# Patient Record
Sex: Male | Born: 1960
Health system: Midwestern US, Community
[De-identification: ages and names within clinical notes are randomized; demographics above are authoritative.]

## PROBLEM LIST (undated history)

## (undated) DIAGNOSIS — I10 Essential (primary) hypertension: Secondary | ICD-10-CM

## (undated) DIAGNOSIS — G629 Polyneuropathy, unspecified: Secondary | ICD-10-CM

## (undated) DIAGNOSIS — C801 Malignant (primary) neoplasm, unspecified: Secondary | ICD-10-CM

## (undated) DIAGNOSIS — R000 Tachycardia, unspecified: Secondary | ICD-10-CM

## (undated) HISTORY — PX: BACK SURGERY: SHX140

## (undated) HISTORY — PX: CYSTOSCOPY: SUR368

---

## 2001-07-03 ENCOUNTER — Ambulatory Visit (HOSPITAL_COMMUNITY): Admission: RE | Admit: 2001-07-03 | Discharge: 2001-07-03 | Payer: Self-pay | Admitting: Neurosurgery

## 2001-07-03 ENCOUNTER — Encounter: Payer: Self-pay | Admitting: Neurosurgery

## 2001-07-15 ENCOUNTER — Encounter: Payer: Self-pay | Admitting: Neurosurgery

## 2001-07-19 ENCOUNTER — Inpatient Hospital Stay (HOSPITAL_COMMUNITY): Admission: RE | Admit: 2001-07-19 | Discharge: 2001-07-20 | Payer: Self-pay | Admitting: Neurosurgery

## 2001-07-19 ENCOUNTER — Encounter: Payer: Self-pay | Admitting: Neurosurgery

## 2004-11-07 ENCOUNTER — Emergency Department (HOSPITAL_COMMUNITY): Admission: EM | Admit: 2004-11-07 | Discharge: 2004-11-08 | Payer: Self-pay | Admitting: Emergency Medicine

## 2005-05-19 ENCOUNTER — Emergency Department (HOSPITAL_COMMUNITY): Admission: EM | Admit: 2005-05-19 | Discharge: 2005-05-19 | Payer: Self-pay | Admitting: Emergency Medicine

## 2006-07-28 IMAGING — CT CT ANGIO CHEST
3 of 6 series · 18 of 30 positions shown · IV contrast ([ID] OMNI 300)
Comparison: none

CLINICAL DATA: Pneumonia.  Left chest pain.  Elevated ______.  Shortness of breath.

[Series 4: pe · axial · 0.70mm/px · z∈[-327,-64]mm · 14 of 492 slices shown]
[im 36/492  lung]
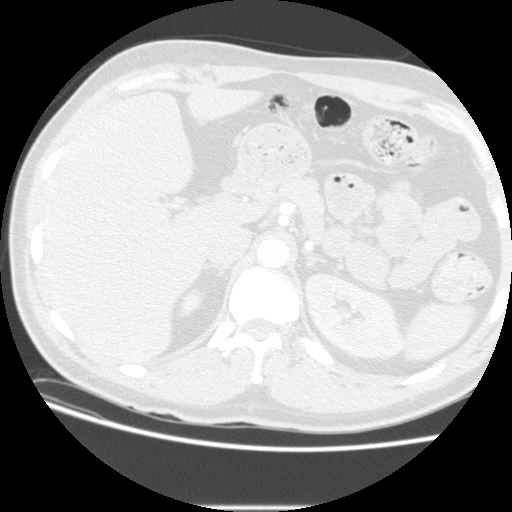
[im 71/492  mediastinal]
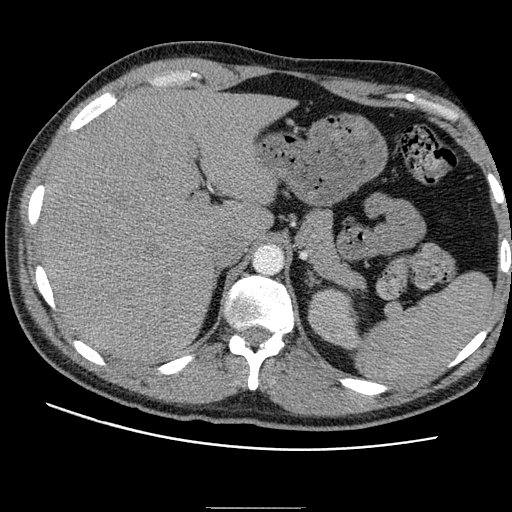
[im 106/492  lung]
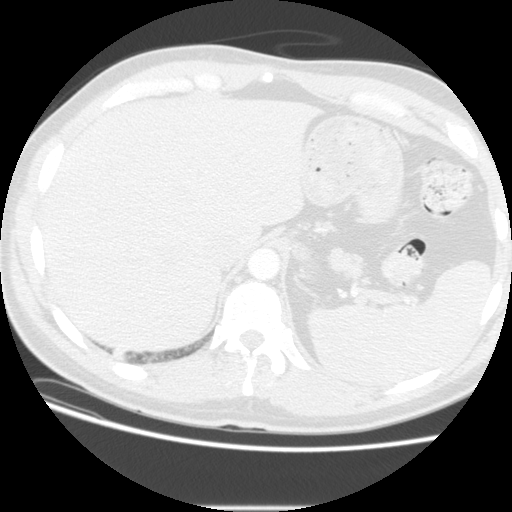
[im 141/492  mediastinal]
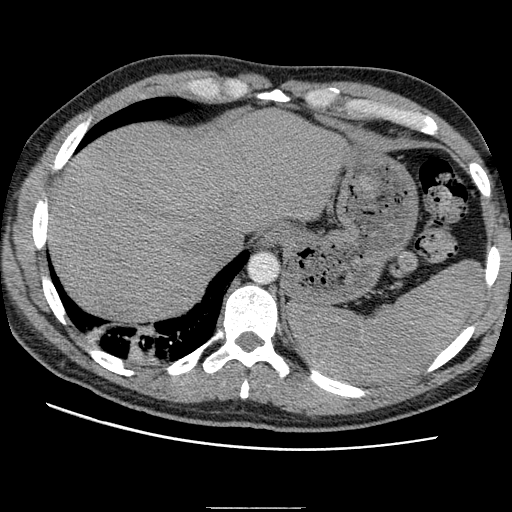
[im 176/492  lung]
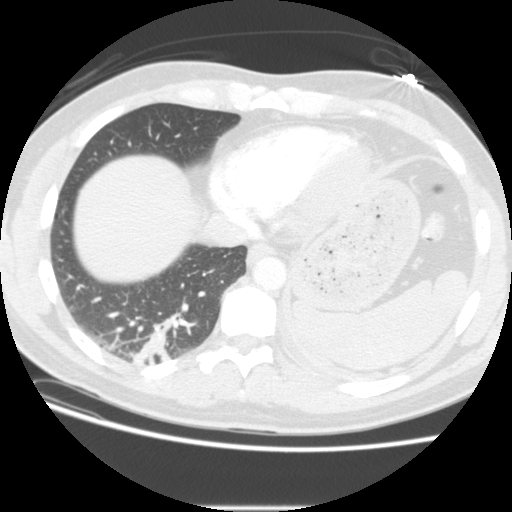
[im 211/492  mediastinal]
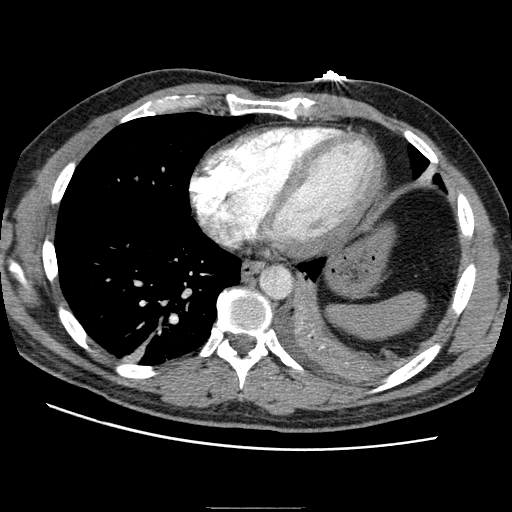
[im 246/492  lung]
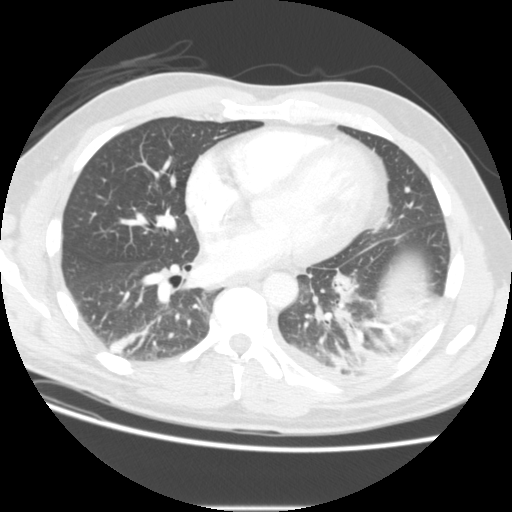
[im 252/492  mediastinal]
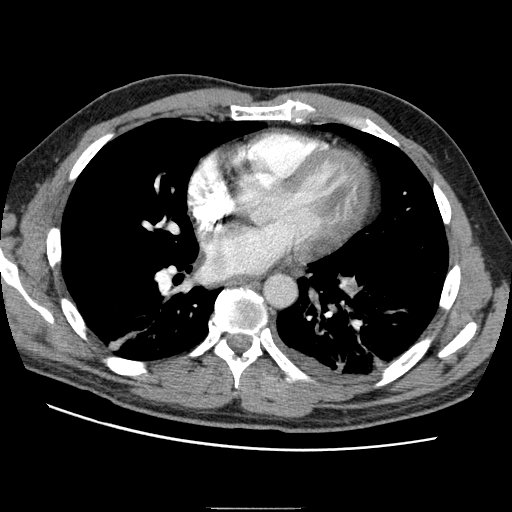
[im 281/492  lung]
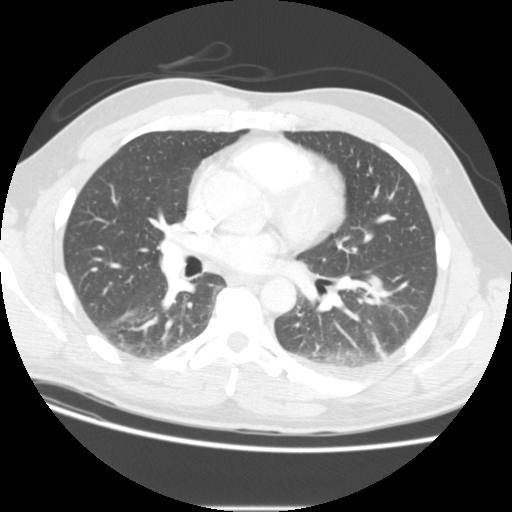
[im 316/492  mediastinal]
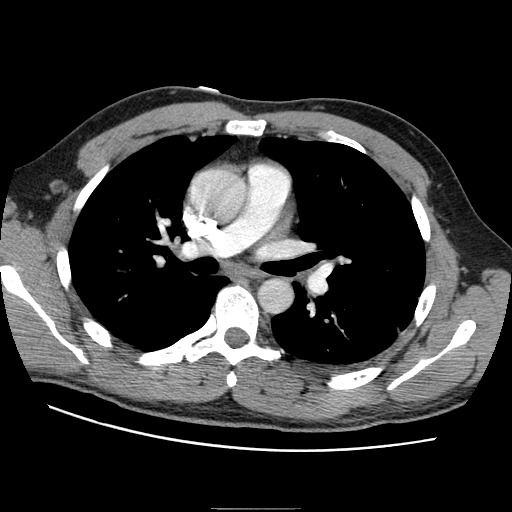
[im 351/492  lung]
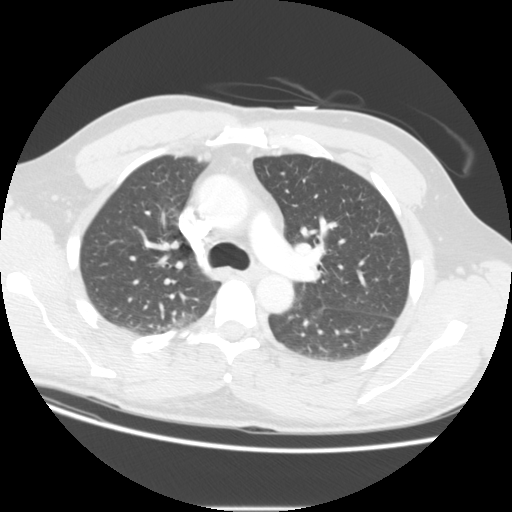
[im 386/492  mediastinal]
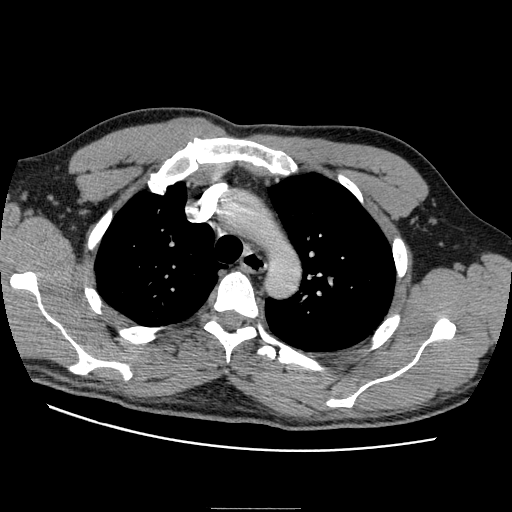
[im 421/492  lung]
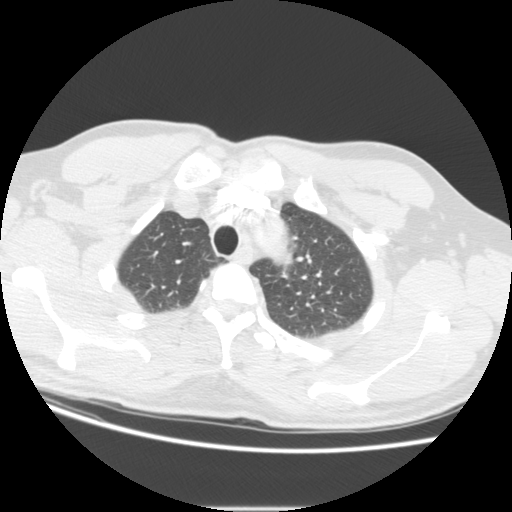
[im 456/492  mediastinal]
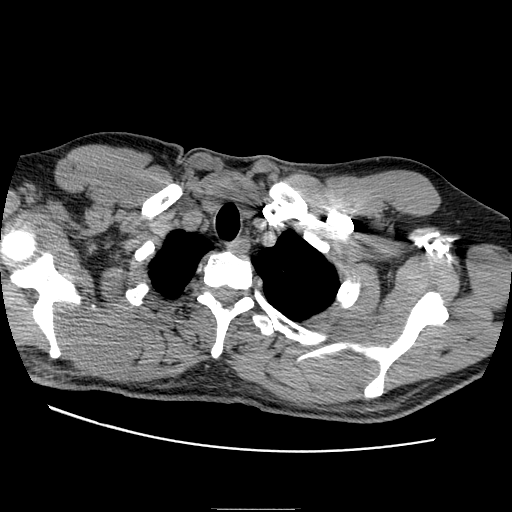

[Series 400: reformatted · sagittal · 0.70mm/px · 2 of 134 slices shown (1 of 2)]
[im 45/134  lung]
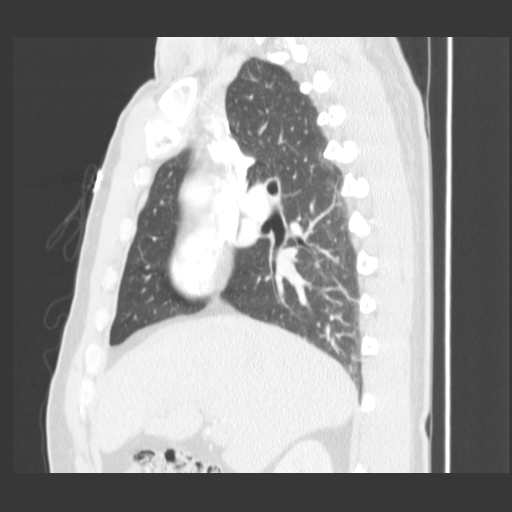
[im 89/134  lung]
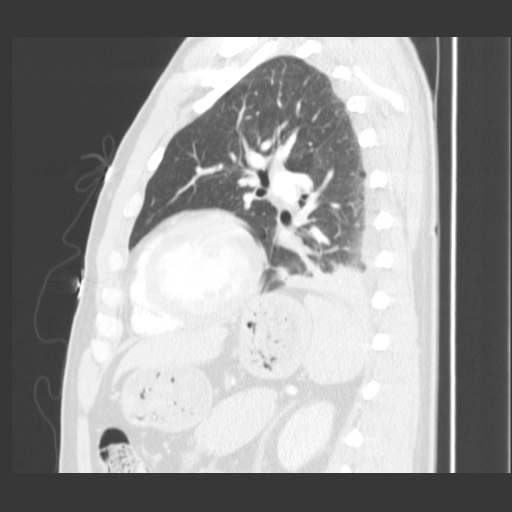

[Series 401: reformatted · coronal · 0.70mm/px · 2 of 115 slices shown (2 of 2)]
[im 39/115  lung]
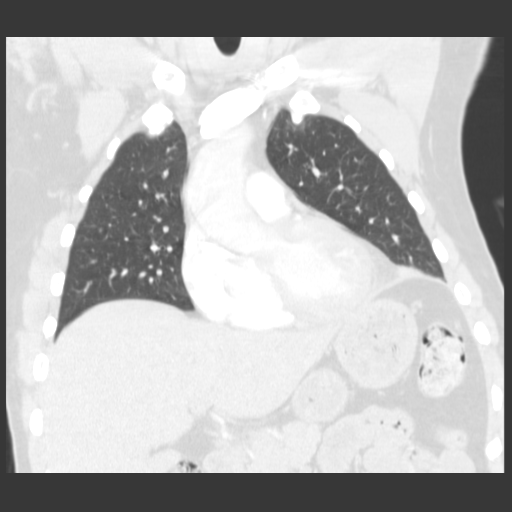
[im 77/115  lung]
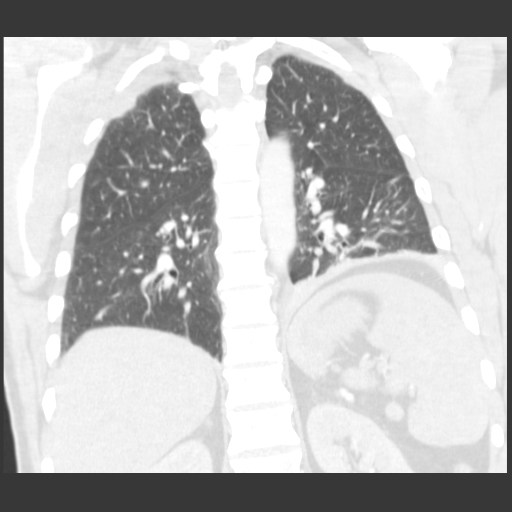

[18 of 30 positions shown; findings below may reference images not displayed]

CHEST CT ANGIO WITH CONTRAST:
Thin section transaxial cuts were initially acquired during IV infusion of 782cc of Omnipaque 300.   From the axial data set images were reconstructed in coronal and sagittal planes.   Findings compatible with bilateral lower lobar pneumonia mainly in the left lower lobe.   Small left pleural effusion.   Atelectatic changes in the lower lobes as well.   Negative for PE.   Slightly prominent in size but not pathologically enlarged mediastinal nodes are noted, suspect representing hyperplastic/reactive nodes.
IMPRESSION: Bilateral lower lobar pneumonia. Left lower lobe bronchitic changes.   Small left pleural effusion.   Negative for PE.

## 2018-08-24 ENCOUNTER — Encounter: Attending: Urology

## 2018-08-24 NOTE — Progress Notes (Deleted)
HISTORY OF PRESENT ILLNESS:  Gary Sloan is a 57 y.o. male who is seen in consultation as referred by Dr. No primary care provider on file. for traumatic injury to external genitalia.        No flowsheet data found.      No past medical history on file.    No past surgical history on file.    Social History     Tobacco Use   ??? Smoking status: Not on file   Substance Use Topics   ??? Alcohol use: Not on file   ??? Drug use: Not on file       Allergies not on file    No family history on file.        Review of Systems  Constitutional: Fever:   Skin: Rash:   HEENT: Hearing difficulty:   Eyes: Blurred vision:   Cardiovascular: Chest pain:   Respiratory: Shortness of breath:   Gastrointestinal: Nausea/vomiting:   Musculoskeletal: Back pain:   Neurological: Weakness:   Psychological: Memory loss:   Comments/additional findings:         PHYSICAL EXAMINATION:   There were no vitals taken for this visit.  Constitutional: WDWN, Pleasant and appropriate affect, No acute distress.    CV:  No peripheral swelling noted  Respiratory: No respiratory distress or difficulties  Abdomen:  No abdominal masses or tenderness. No CVA tenderness. No inguinal hernias noted.   GU Male:    [id number] normal to visual inspection, no erythema or irritation, Sphincter with good tone, Rectum with no hemorrhoids, fissures or masses, Prostate smooth, symmetric and anodular. Prostate is {Small, med, mod, large:15557}.  SCROTUM:  No scrotal rash or lesions noticed.  Normal bilateral testes and epididymis.   PENIS: Urethral meatus normal in location and size. No urethral discharge.  Skin: No jaundice.    Neuro/Psych:  Alert and oriented x 3, affect appropriate.   Lymphatic:   No enlarged inguinal lymph nodes.        REVIEW OF LABS AND IMAGING:    No results found for this or any previous visit.    ASSESSMENT:   No diagnosis found.        PLAN:    ***           No chief complaint on file.           A copy of today's office visit was sent to the referring physician.      Salome Spotted

## 2018-09-06 ENCOUNTER — Encounter: Attending: Urology

## 2018-09-06 NOTE — Progress Notes (Deleted)
09/06/2018     No chief complaint on file.      HISTORY OF PRESENT ILLNESS:  Gary Sloan is a 57 y.o. male who is seen in consultation as referred by Dr. No primary care provider on file. for ***.      Location:    Duration:  Years     Months   Weeks    Days  Associated symptoms: nausea  vomiting   fever   chills   weight loss   Severity: mild   moderate   severe  Quality: sharp   dull   ache  pressure  Timing:  At time of activity   When resting    Constant    Associated with food intake    No flowsheet data found.      Past Medical History:   Diagnosis Date   ??? Peripheral edema        No past surgical history on file.    Social History     Tobacco Use   ??? Smoking status: Current Every Day Smoker     Packs/day: 1.00   Substance Use Topics   ??? Alcohol use: Never     Frequency: Never   ??? Drug use: Never       Allergies not on file    No family history on file.        Review of Systems  Constitutional: Fever:   Skin: Rash:   HEENT: Hearing difficulty:   Eyes: Blurred vision:   Cardiovascular: Chest pain:   Respiratory: Shortness of breath:   Gastrointestinal: Nausea/vomiting:   Musculoskeletal: Back pain:   Neurological: Weakness:   Psychological: Memory loss:   Comments/additional findings:         PHYSICAL EXAMINATION:   There were no vitals taken for this visit.  Constitutional: WDWN, Pleasant and appropriate affect, No acute distress.    CV:  No peripheral swelling noted  Respiratory: No respiratory distress or difficulties  Abdomen:  No abdominal masses or tenderness. No CVA tenderness. No inguinal hernias noted.   GU Male:    [id number]:Perineum normal to visual inspection, no erythema or irritation, Sphincter with good tone, Rectum with no hemorrhoids, fissures or masses, Prostate smooth, symmetric and anodular. Prostate is {Small, med, mod, large:15557}.  SCROTUM:  No scrotal rash or lesions noticed.  Normal bilateral testes and epididymis.    PENIS: Urethral meatus normal in location and size. No urethral discharge.  Skin: No jaundice.    Neuro/Psych:  Alert and oriented x 3, affect appropriate.   Lymphatic:   No enlarged inguinal lymph nodes.        REVIEW OF LABS AND IMAGING:    No results found for this or any previous visit.    ASSESSMENT:   No diagnosis found.        PLAN:    ***       There is no height or weight on file to calculate BMI. Patient's BMI is out of the normal parameters.  Information about BMI was given to the patient.     Philis FendtLetitia Achillies Buehl

## 2018-09-21 ENCOUNTER — Encounter: Attending: Urology

## 2018-09-21 NOTE — Progress Notes (Deleted)
HISTORY OF PRESENT ILLNESS:  Gary Sloan is a 57 y.o. male who is seen in consultation as referred by Dr. Darlis LoanWesley Turner for traumatic injury of external genitalia.       Location:    Duration:  Years     Months   Weeks    Days  Associated symptoms: nausea  vomiting   fever   chills   weight loss   Severity: mild   moderate   severe  Quality: sharp   dull   ache  pressure  Timing:  At time of activity   When resting    Constant    Associated with food intake    No flowsheet data found.      Past Medical History:   Diagnosis Date   ??? Peripheral edema        No past surgical history on file.    Social History     Tobacco Use   ??? Smoking status: Current Every Day Smoker     Packs/day: 1.00   Substance Use Topics   ??? Alcohol use: Never     Frequency: Never   ??? Drug use: Never       Allergies not on file    No family history on file.        Review of Systems  Constitutional: Fever:   Skin: Rash:   HEENT: Hearing difficulty:   Eyes: Blurred vision:   Cardiovascular: Chest pain:   Respiratory: Shortness of breath:   Gastrointestinal: Nausea/vomiting:   Musculoskeletal: Back pain:   Neurological: Weakness:   Psychological: Memory loss:     PHYSICAL EXAMINATION:   There were no vitals taken for this visit.  Constitutional: WDWN, Pleasant and appropriate affect, No acute distress.    CV:  No peripheral swelling noted  Respiratory: No respiratory distress or difficulties  Abdomen:  No abdominal masses or tenderness. No CVA tenderness. No inguinal hernias noted.   GU Male:    [id number]:Perineum normal to visual inspection, no erythema or irritation, Sphincter with good tone, Rectum with no hemorrhoids, fissures or masses, Prostate smooth, symmetric and anodular. Prostate is {Small, med, mod, large:15557}.  SCROTUM:  No scrotal rash or lesions noticed.  Normal bilateral testes and epididymis.   PENIS: Urethral meatus normal in location and size. No urethral discharge.  Skin: No jaundice.     Neuro/Psych:  Alert and oriented x 3, affect appropriate.   Lymphatic:   No enlarged inguinal lymph nodes.        REVIEW OF LABS AND IMAGING:    No results found for this or any previous visit.    ASSESSMENT:   No diagnosis found.        PLAN:    ***         No chief complaint on file.      A copy of today's office visit was sent to the referring physician.      Lin Givenslaire McHugh     Medical documentation entered into the chart by Lin Givenslaire McHugh, medical scribe for Lin Givenslaire McHugh on 09/21/2018.

## 2023-01-11 ENCOUNTER — Emergency Department (HOSPITAL_COMMUNITY)
Admission: EM | Admit: 2023-01-11 | Discharge: 2023-01-12 | Discharge status: Against Medical Advice | Payer: Medicare HMO | Attending: Emergency Medicine | Admitting: Emergency Medicine

## 2023-01-11 ENCOUNTER — Emergency Department (HOSPITAL_COMMUNITY): Payer: Medicare HMO

## 2023-01-11 ENCOUNTER — Encounter (HOSPITAL_COMMUNITY): Payer: Self-pay | Admitting: *Deleted

## 2023-01-11 ENCOUNTER — Other Ambulatory Visit: Payer: Self-pay

## 2023-01-11 DIAGNOSIS — R5383 Other fatigue: Secondary | ICD-10-CM | POA: Diagnosis not present

## 2023-01-11 DIAGNOSIS — R319 Hematuria, unspecified: Secondary | ICD-10-CM | POA: Insufficient documentation

## 2023-01-11 DIAGNOSIS — K920 Hematemesis: Secondary | ICD-10-CM | POA: Insufficient documentation

## 2023-01-11 DIAGNOSIS — R109 Unspecified abdominal pain: Secondary | ICD-10-CM | POA: Insufficient documentation

## 2023-01-11 DIAGNOSIS — K921 Melena: Secondary | ICD-10-CM | POA: Diagnosis not present

## 2023-01-11 DIAGNOSIS — Z5321 Procedure and treatment not carried out due to patient leaving prior to being seen by health care provider: Secondary | ICD-10-CM | POA: Diagnosis not present

## 2023-01-11 DIAGNOSIS — R42 Dizziness and giddiness: Secondary | ICD-10-CM | POA: Insufficient documentation

## 2023-01-11 DIAGNOSIS — R300 Dysuria: Secondary | ICD-10-CM | POA: Insufficient documentation

## 2023-01-11 HISTORY — DX: Essential (primary) hypertension: I10

## 2023-01-11 LAB — COMPREHENSIVE METABOLIC PANEL
ALT: 15 U/L (ref 0–44)
AST: 23 U/L (ref 15–41)
Albumin: 3.8 g/dL (ref 3.5–5.0)
Alkaline Phosphatase: 100 U/L (ref 38–126)
Anion gap: 11 (ref 5–15)
BUN: 7 mg/dL (ref 6–20)
CO2: 24 mmol/L (ref 22–32)
Calcium: 8.6 mg/dL — ABNORMAL LOW (ref 8.9–10.3)
Chloride: 106 mmol/L (ref 98–111)
Creatinine, Ser: 0.86 mg/dL (ref 0.61–1.24)
GFR, Estimated: >60 mL/min (ref >60)
Glucose, Bld: 97 mg/dL (ref 70–99)
Potassium: 3.7 mmol/L (ref 3.5–5.1)
Sodium: 141 mmol/L (ref 135–145)
Total Bilirubin: 0.5 mg/dL (ref 0.3–1.2)
Total Protein: 7.3 g/dL (ref 6.5–8.1)

## 2023-01-11 LAB — CBC
HCT: 31.6 % — ABNORMAL LOW (ref 39.0–52.0)
Hemoglobin: 10.5 g/dL — ABNORMAL LOW (ref 13.0–17.0)
MCH: 31.9 pg (ref 26.0–34.0)
MCHC: 33.2 g/dL (ref 30.0–36.0)
MCV: 96 fL (ref 80.0–100.0)
Platelets: 282 10*3/uL (ref 150–400)
RBC: 3.29 MIL/uL — ABNORMAL LOW (ref 4.22–5.81)
RDW: 13.2 % (ref 11.5–15.5)
WBC: 6.9 10*3/uL (ref 4.0–10.5)
nRBC: 0 % (ref 0.0–0.2)

## 2023-01-11 LAB — LIPASE, BLOOD: Lipase: 27 U/L (ref 11–51)

## 2023-01-11 MED ORDER — IOHEXOL 300 MG/ML  SOLN: 100.0000 mL | Freq: Once | INTRAMUSCULAR | Status: DC | PRN

## 2023-01-11 NOTE — ED Provider Notes (Incomplete)
  [facility] Provider Note   CSN: [id number] Arrival date & time: 01/11/23  1825     History {Add pertinent medical, surgical, social history, OB history to HPI:1} Chief Complaint  Patient presents with   Hematemesis    Vincent Cobb is a 62 y.o. male.  The history is provided by the patient.  He has history of hypertension, bladder cancer   Home Medications Prior to Admission medications   Not on File      Allergies    Patient has no known allergies.    Review of Systems   Review of Systems  All other systems reviewed and are negative.   Physical Exam Updated Vital Signs BP 126/78 (BP Location: Left Arm)   Pulse (!) 114   Temp 97.8 F (36.6 C) (Temporal)   Resp 20   Ht 6\' 2"  (1.88 m)   Wt 115.7 kg   SpO2 97%   BMI 32.74 kg/m  Physical Exam Vitals and nursing note reviewed.   62 year old male, resting comfortably and in no acute distress. Vital signs are ***. Oxygen saturation is ***%, which is normal. Head is normocephalic and atraumatic. PERRLA, EOMI. Oropharynx is clear. Neck is nontender and supple without adenopathy or JVD. Back is nontender and there is no CVA tenderness. Lungs are clear without rales, wheezes, or rhonchi. Chest is nontender. Heart has regular rate and rhythm without murmur. Abdomen is soft, flat, nontender without masses or hepatosplenomegaly and peristalsis is normoactive. Extremities have no cyanosis or edema, full range of motion is present. Skin is warm and dry without rash. Neurologic: Mental status is normal, cranial nerves are intact, there are no motor or sensory deficits.  ED Results / Procedures / Treatments   Labs (all labs ordered are listed, but only abnormal results are displayed) Labs Reviewed  COMPREHENSIVE METABOLIC PANEL - Abnormal; Notable for the following components:      Result Value   Calcium 8.6 (*)    All other components within normal limits  CBC -  Abnormal; Notable for the following components:   RBC 3.29 (*)    Hemoglobin 10.5 (*)    HCT 31.6 (*)    All other components within normal limits  LIPASE, BLOOD  URINALYSIS, ROUTINE W REFLEX MICROSCOPIC    EKG None  Radiology No results found.  Procedures Procedures  {Document cardiac monitor, telemetry assessment procedure when appropriate:1}  Medications Ordered in ED Medications - No data to display  ED Course/ Medical Decision Making/ A&P   {   Click here for ABCD2, HEART and other calculatorsREFRESH Note before signing :1}                          Medical Decision Making Amount and/or Complexity of Data Reviewed Labs: ordered.   ***  {Document critical care time when appropriate:1} {Document review of labs and clinical decision tools ie heart score, Chads2Vasc2 etc:1}  {Document your independent review of radiology images, and any outside records:1} {Document your discussion with family members, caretakers, and with consultants:1} {Document social determinants of health affecting pt's care:1} {Document your decision making why or why not admission, treatments were needed:1} Final Clinical Impression(s) / ED Diagnoses Final diagnoses:  None    Rx / DC Orders ED Discharge Orders     None

## 2023-01-11 NOTE — ED Triage Notes (Signed)
Pt with hematuria, hematemesis and blood in stool x 3 days.  Pt denies taking any blood thinners. Left flank pain. Known bladder tumor.

## 2023-01-11 NOTE — ED Provider Triage Note (Signed)
Emergency Medicine Provider Triage Evaluation Note  Vincent Cobb , a 62 y.o. male  was evaluated in triage.  Pt complains of hematemesis, melena, hematuria.  Has known bladder cancer but states that he has increased dysuria.  Has been voiding frank blood for 3 months but got worse 3 days ago.  Reports melena and hematochezia for the past week.  Has had numerous episodes of hematemesis as well.  Reports abdominal pain, nausea, dizziness, lightheadedness, fatigue.  Denies fevers or chills.  Review of Systems  Positive: As above Negative: As above  Physical Exam  BP 126/78 (BP Location: Left Arm)   Pulse (!) 114   Temp 97.8 F (36.6 C) (Temporal)   Resp 20   Ht 6\' 2"  (1.88 m)   Wt 115.7 kg   SpO2 97%   BMI 32.74 kg/m  Gen:   Awake, moderate distress  Resp:  Normal effort  MSK:   Moves extremities without difficulty  Other:    Medical Decision Making  Medically screening exam initiated at 6:45 PM.  Appropriate orders placed.  Reyce Goddard was informed that the remainder of the evaluation will be completed by another provider, this initial triage assessment does not replace that evaluation, and the importance of remaining in the ED until their evaluation is complete.     Roylene Reason, [place] 01/11/23 1846

## 2023-08-04 ENCOUNTER — Inpatient Hospital Stay (HOSPITAL_COMMUNITY): Payer: Medicare HMO

## 2023-08-04 ENCOUNTER — Encounter (HOSPITAL_COMMUNITY): Payer: Self-pay

## 2023-08-04 ENCOUNTER — Inpatient Hospital Stay (HOSPITAL_COMMUNITY)
Admission: EM | Admit: 2023-08-04 | Discharge: 2023-08-12 | DRG: 603 | Disposition: A | Discharge status: Home / Self Care | Payer: Medicare HMO | Source: Ambulatory Visit | Attending: Internal Medicine | Admitting: Internal Medicine

## 2023-08-04 ENCOUNTER — Emergency Department (HOSPITAL_COMMUNITY): Payer: Medicare HMO

## 2023-08-04 ENCOUNTER — Other Ambulatory Visit: Payer: Self-pay

## 2023-08-04 DIAGNOSIS — E669 Obesity, unspecified: Secondary | ICD-10-CM | POA: Diagnosis present

## 2023-08-04 DIAGNOSIS — I4719 Other supraventricular tachycardia: Secondary | ICD-10-CM | POA: Diagnosis present

## 2023-08-04 DIAGNOSIS — I4892 Unspecified atrial flutter: Secondary | ICD-10-CM | POA: Diagnosis present

## 2023-08-04 DIAGNOSIS — Z888 Allergy status to other drugs, medicaments and biological substances status: Secondary | ICD-10-CM

## 2023-08-04 DIAGNOSIS — D62 Acute posthemorrhagic anemia: Secondary | ICD-10-CM | POA: Diagnosis present

## 2023-08-04 DIAGNOSIS — I5032 Chronic diastolic (congestive) heart failure: Secondary | ICD-10-CM | POA: Diagnosis present

## 2023-08-04 DIAGNOSIS — M79605 Pain in left leg: Secondary | ICD-10-CM | POA: Diagnosis present

## 2023-08-04 DIAGNOSIS — L03119 Cellulitis of unspecified part of limb: Principal | ICD-10-CM

## 2023-08-04 DIAGNOSIS — M79604 Pain in right leg: Secondary | ICD-10-CM | POA: Diagnosis present

## 2023-08-04 DIAGNOSIS — L88 Pyoderma gangrenosum: Secondary | ICD-10-CM | POA: Diagnosis present

## 2023-08-04 DIAGNOSIS — L0390 Cellulitis, unspecified: Secondary | ICD-10-CM

## 2023-08-04 DIAGNOSIS — Z720 Tobacco use: Secondary | ICD-10-CM | POA: Diagnosis not present

## 2023-08-04 DIAGNOSIS — I110 Hypertensive heart disease with heart failure: Secondary | ICD-10-CM | POA: Diagnosis present

## 2023-08-04 DIAGNOSIS — L97411 Non-pressure chronic ulcer of right heel and midfoot limited to breakdown of skin: Secondary | ICD-10-CM | POA: Diagnosis present

## 2023-08-04 DIAGNOSIS — F17210 Nicotine dependence, cigarettes, uncomplicated: Secondary | ICD-10-CM | POA: Diagnosis present

## 2023-08-04 DIAGNOSIS — Z79899 Other long term (current) drug therapy: Secondary | ICD-10-CM

## 2023-08-04 DIAGNOSIS — L03115 Cellulitis of right lower limb: Secondary | ICD-10-CM | POA: Diagnosis present

## 2023-08-04 DIAGNOSIS — Z23 Encounter for immunization: Secondary | ICD-10-CM | POA: Diagnosis present

## 2023-08-04 DIAGNOSIS — G629 Polyneuropathy, unspecified: Secondary | ICD-10-CM | POA: Diagnosis present

## 2023-08-04 DIAGNOSIS — L03116 Cellulitis of left lower limb: Secondary | ICD-10-CM | POA: Diagnosis present

## 2023-08-04 DIAGNOSIS — E785 Hyperlipidemia, unspecified: Secondary | ICD-10-CM | POA: Diagnosis present

## 2023-08-04 DIAGNOSIS — G8929 Other chronic pain: Secondary | ICD-10-CM | POA: Diagnosis present

## 2023-08-04 DIAGNOSIS — R7982 Elevated C-reactive protein (CRP): Secondary | ICD-10-CM | POA: Diagnosis not present

## 2023-08-04 DIAGNOSIS — N179 Acute kidney failure, unspecified: Secondary | ICD-10-CM

## 2023-08-04 DIAGNOSIS — E079 Disorder of thyroid, unspecified: Secondary | ICD-10-CM | POA: Diagnosis present

## 2023-08-04 DIAGNOSIS — L97511 Non-pressure chronic ulcer of other part of right foot limited to breakdown of skin: Secondary | ICD-10-CM | POA: Diagnosis present

## 2023-08-04 DIAGNOSIS — L97519 Non-pressure chronic ulcer of other part of right foot with unspecified severity: Secondary | ICD-10-CM

## 2023-08-04 DIAGNOSIS — Z1152 Encounter for screening for COVID-19: Secondary | ICD-10-CM

## 2023-08-04 DIAGNOSIS — I872 Venous insufficiency (chronic) (peripheral): Secondary | ICD-10-CM | POA: Diagnosis present

## 2023-08-04 DIAGNOSIS — R700 Elevated erythrocyte sedimentation rate: Secondary | ICD-10-CM | POA: Diagnosis present

## 2023-08-04 DIAGNOSIS — M549 Dorsalgia, unspecified: Secondary | ICD-10-CM | POA: Diagnosis present

## 2023-08-04 DIAGNOSIS — N3289 Other specified disorders of bladder: Secondary | ICD-10-CM

## 2023-08-04 DIAGNOSIS — C679 Malignant neoplasm of bladder, unspecified: Secondary | ICD-10-CM | POA: Diagnosis present

## 2023-08-04 DIAGNOSIS — R7881 Bacteremia: Secondary | ICD-10-CM | POA: Diagnosis present

## 2023-08-04 DIAGNOSIS — R310 Gross hematuria: Secondary | ICD-10-CM | POA: Diagnosis present

## 2023-08-04 DIAGNOSIS — L98499 Non-pressure chronic ulcer of skin of other sites with unspecified severity: Secondary | ICD-10-CM | POA: Diagnosis not present

## 2023-08-04 DIAGNOSIS — D649 Anemia, unspecified: Secondary | ICD-10-CM

## 2023-08-04 DIAGNOSIS — Z6831 Body mass index (BMI) 31.0-31.9, adult: Secondary | ICD-10-CM

## 2023-08-04 DIAGNOSIS — L97421 Non-pressure chronic ulcer of left heel and midfoot limited to breakdown of skin: Secondary | ICD-10-CM | POA: Diagnosis present

## 2023-08-04 DIAGNOSIS — L97521 Non-pressure chronic ulcer of other part of left foot limited to breakdown of skin: Secondary | ICD-10-CM | POA: Diagnosis present

## 2023-08-04 HISTORY — DX: Polyneuropathy, unspecified: G62.9

## 2023-08-04 HISTORY — DX: Tachycardia, unspecified: R00.0

## 2023-08-04 HISTORY — DX: Malignant (primary) neoplasm, unspecified: C80.1

## 2023-08-04 LAB — URINALYSIS, W/ REFLEX TO CULTURE (INFECTION SUSPECTED)
Glucose, UA: NEGATIVE mg/dL
RBC / HPF: >50 RBC/hpf (ref 0–5)

## 2023-08-04 LAB — RESP PANEL BY RT-PCR (RSV, FLU A&B, COVID)  RVPGX2
Influenza A by PCR: NEGATIVE
Influenza B by PCR: NEGATIVE
Resp Syncytial Virus by PCR: NEGATIVE
SARS Coronavirus 2 by RT PCR: NEGATIVE

## 2023-08-04 LAB — CBC WITH DIFFERENTIAL/PLATELET
Abs Immature Granulocytes: 0.04 10*3/uL (ref 0.00–0.07)
Basophils Absolute: 0.1 10*3/uL (ref 0.0–0.1)
Basophils Relative: 1 %
Eosinophils Absolute: 0.3 10*3/uL (ref 0.0–0.5)
Eosinophils Relative: 3 %
HCT: 30.4 % — ABNORMAL LOW (ref 39.0–52.0)
Hemoglobin: 9.9 g/dL — ABNORMAL LOW (ref 13.0–17.0)
Immature Granulocytes: 0 %
Lymphocytes Relative: 12 %
Lymphs Abs: 1.2 10*3/uL (ref 0.7–4.0)
MCH: 30.5 pg (ref 26.0–34.0)
MCHC: 32.6 g/dL (ref 30.0–36.0)
MCV: 93.5 fL (ref 80.0–100.0)
Monocytes Absolute: 0.6 10*3/uL (ref 0.1–1.0)
Monocytes Relative: 6 %
Neutro Abs: 8 10*3/uL — ABNORMAL HIGH (ref 1.7–7.7)
Neutrophils Relative %: 78 %
Platelets: 243 10*3/uL (ref 150–400)
RBC: 3.25 MIL/uL — ABNORMAL LOW (ref 4.22–5.81)
RDW: 15.1 % (ref 11.5–15.5)
WBC: 10.3 10*3/uL (ref 4.0–10.5)
nRBC: 0 % (ref 0.0–0.2)

## 2023-08-04 LAB — COMPREHENSIVE METABOLIC PANEL
ALT: 12 U/L (ref 0–44)
AST: 19 U/L (ref 15–41)
Albumin: 3.5 g/dL (ref 3.5–5.0)
Alkaline Phosphatase: 105 U/L (ref 38–126)
Anion gap: 9 (ref 5–15)
BUN: 17 mg/dL (ref 8–23)
CO2: 22 mmol/L (ref 22–32)
Calcium: 9.4 mg/dL (ref 8.9–10.3)
Chloride: 106 mmol/L (ref 98–111)
Creatinine, Ser: 1.46 mg/dL — ABNORMAL HIGH (ref 0.61–1.24)
GFR, Estimated: 54 mL/min — ABNORMAL LOW (ref >60)
Glucose, Bld: 129 mg/dL — ABNORMAL HIGH (ref 70–99)
Potassium: 4.1 mmol/L (ref 3.5–5.1)
Sodium: 137 mmol/L (ref 135–145)
Total Bilirubin: 0.6 mg/dL (ref 0.3–1.2)
Total Protein: 8 g/dL (ref 6.5–8.1)

## 2023-08-04 LAB — I-STAT CG4 LACTIC ACID, ED
Lactic Acid, Venous: 1 mmol/L (ref 0.5–1.9)
Lactic Acid, Venous: 1.3 mmol/L (ref 0.5–1.9)

## 2023-08-04 LAB — PROTIME-INR
INR: 1.2 (ref 0.8–1.2)
Prothrombin Time: 15.3 s — ABNORMAL HIGH (ref 11.4–15.2)

## 2023-08-04 LAB — LIPASE, BLOOD: Lipase: 20 U/L (ref 11–51)

## 2023-08-04 LAB — SEDIMENTATION RATE: Sed Rate: 48 mm/h — ABNORMAL HIGH (ref 0–16)

## 2023-08-04 LAB — APTT: aPTT: 29 s (ref 24–36)

## 2023-08-04 MED ORDER — VANCOMYCIN HCL 10 G IV SOLR
2500.0000 mg | Freq: Once | INTRAVENOUS | Status: AC
  Administered 2023-08-04: 2500 mg via INTRAVENOUS
  Filled 2023-08-04: qty 2500

## 2023-08-04 MED ORDER — METOPROLOL SUCCINATE ER 50 MG PO TB24
100.0000 mg | ORAL_TABLET | Freq: Every day | ORAL | Status: DC
  Administered 2023-08-05 – 2023-08-12 (×8): 100 mg via ORAL
  Filled 2023-08-04 (×9): qty 2

## 2023-08-04 MED ORDER — BACLOFEN 10 MG PO TABS
10.0000 mg | ORAL_TABLET | Freq: Three times a day (TID) | ORAL | Status: DC
  Administered 2023-08-04 – 2023-08-12 (×23): 10 mg via ORAL
  Filled 2023-08-04 (×23): qty 1

## 2023-08-04 MED ORDER — VANCOMYCIN HCL IN DEXTROSE 1-5 GM/200ML-% IV SOLN: 1000.0000 mg | Freq: Once | INTRAVENOUS | Status: DC

## 2023-08-04 MED ORDER — LACTATED RINGERS IV BOLUS (SEPSIS)
1000.0000 mL | Freq: Once | INTRAVENOUS | Status: AC
  Administered 2023-08-04: 1000 mL via INTRAVENOUS

## 2023-08-04 MED ORDER — FENTANYL CITRATE PF 50 MCG/ML IJ SOSY
25.0000 ug | PREFILLED_SYRINGE | Freq: Once | INTRAMUSCULAR | Status: AC
  Administered 2023-08-04: 25 ug via INTRAVENOUS
  Filled 2023-08-04: qty 1

## 2023-08-04 MED ORDER — LACTATED RINGERS IV BOLUS (SEPSIS): 500.0000 mL | Freq: Once | INTRAVENOUS | Status: DC

## 2023-08-04 MED ORDER — SODIUM CHLORIDE 0.9 % IV SOLN
2.0000 g | Freq: Once | INTRAVENOUS | Status: AC
  Administered 2023-08-04: 2 g via INTRAVENOUS
  Filled 2023-08-04: qty 12.5

## 2023-08-04 MED ORDER — LACTATED RINGERS IV SOLN: INTRAVENOUS | Status: AC

## 2023-08-04 MED ORDER — LACTATED RINGERS IV SOLN: INTRAVENOUS | Status: DC

## 2023-08-04 MED ORDER — SODIUM CHLORIDE 0.9 % IV SOLN
2.0000 g | Freq: Three times a day (TID) | INTRAVENOUS | Status: DC
  Administered 2023-08-05 – 2023-08-09 (×14): 2 g via INTRAVENOUS
  Filled 2023-08-04 (×14): qty 12.5

## 2023-08-04 MED ORDER — EZETIMIBE 10 MG PO TABS
10.0000 mg | ORAL_TABLET | Freq: Every day | ORAL | Status: DC
  Administered 2023-08-05 – 2023-08-12 (×8): 10 mg via ORAL
  Filled 2023-08-04 (×8): qty 1

## 2023-08-04 MED ORDER — GABAPENTIN 300 MG PO CAPS
300.0000 mg | ORAL_CAPSULE | Freq: Three times a day (TID) | ORAL | Status: DC
  Administered 2023-08-04 – 2023-08-12 (×23): 300 mg via ORAL
  Filled 2023-08-04 (×23): qty 1

## 2023-08-04 MED ORDER — LACTATED RINGERS IV BOLUS: 1000.0000 mL | Freq: Once | INTRAVENOUS | Status: DC

## 2023-08-04 MED ORDER — INFLUENZA VIRUS VACC SPLIT PF (FLUZONE) 0.5 ML IM SUSY
0.5000 mL | PREFILLED_SYRINGE | INTRAMUSCULAR | Status: AC
  Administered 2023-08-05: 0.5 mL via INTRAMUSCULAR
  Filled 2023-08-04: qty 0.5

## 2023-08-04 MED ORDER — NICOTINE 14 MG/24HR TD PT24
14.0000 mg | MEDICATED_PATCH | Freq: Every day | TRANSDERMAL | Status: DC
  Administered 2023-08-05 – 2023-08-09 (×5): 14 mg via TRANSDERMAL
  Filled 2023-08-04 (×6): qty 1

## 2023-08-04 MED ORDER — OXYCODONE-ACETAMINOPHEN 5-325 MG PO TABS
1.0000 | ORAL_TABLET | Freq: Four times a day (QID) | ORAL | Status: DC | PRN
  Administered 2023-08-05 – 2023-08-09 (×9): 2 via ORAL
  Administered 2023-08-10: 1 via ORAL
  Administered 2023-08-10 – 2023-08-12 (×5): 2 via ORAL
  Filled 2023-08-04 (×15): qty 2

## 2023-08-04 MED ORDER — SODIUM CHLORIDE 0.9 % IV SOLN
2.0000 g | Freq: Once | INTRAVENOUS | Status: AC
  Administered 2023-08-04: 2 g via INTRAVENOUS
  Filled 2023-08-04: qty 20

## 2023-08-04 MED ORDER — TAMSULOSIN HCL 0.4 MG PO CAPS
0.4000 mg | ORAL_CAPSULE | Freq: Every day | ORAL | Status: DC
  Administered 2023-08-05 – 2023-08-11 (×7): 0.4 mg via ORAL
  Filled 2023-08-04 (×7): qty 1

## 2023-08-04 MED ORDER — VANCOMYCIN HCL 750 MG/150ML IV SOLN
750.0000 mg | Freq: Two times a day (BID) | INTRAVENOUS | Status: DC
  Administered 2023-08-05: 750 mg via INTRAVENOUS
  Filled 2023-08-04 (×2): qty 150

## 2023-08-04 MED ORDER — LACTATED RINGERS IV BOLUS (SEPSIS): 1000.0000 mL | Freq: Once | INTRAVENOUS | Status: DC

## 2023-08-04 MED ORDER — BUSPIRONE HCL 5 MG PO TABS
15.0000 mg | ORAL_TABLET | Freq: Two times a day (BID) | ORAL | Status: DC
  Administered 2023-08-04 – 2023-08-12 (×16): 15 mg via ORAL
  Filled 2023-08-04 (×12): qty 1
  Filled 2023-08-04: qty 2
  Filled 2023-08-04 (×3): qty 1

## 2023-08-04 MED ORDER — GABAPENTIN 100 MG PO CAPS
100.0000 mg | ORAL_CAPSULE | Freq: Once | ORAL | Status: AC
  Administered 2023-08-04: 100 mg via ORAL
  Filled 2023-08-04: qty 1

## 2023-08-04 MED ORDER — ACETAMINOPHEN 325 MG PO TABS
650.0000 mg | ORAL_TABLET | Freq: Four times a day (QID) | ORAL | Status: DC | PRN
  Administered 2023-08-08 – 2023-08-12 (×3): 650 mg via ORAL
  Filled 2023-08-04 (×3): qty 2

## 2023-08-04 MED ORDER — HYDROMORPHONE HCL 1 MG/ML IJ SOLN
1.0000 mg | INTRAMUSCULAR | Status: DC | PRN
  Administered 2023-08-04 – 2023-08-11 (×25): 1 mg via INTRAVENOUS
  Filled 2023-08-04 (×27): qty 1

## 2023-08-04 MED ORDER — PANTOPRAZOLE SODIUM 40 MG PO TBEC
80.0000 mg | DELAYED_RELEASE_TABLET | Freq: Every day | ORAL | Status: DC
  Administered 2023-08-05 – 2023-08-12 (×8): 80 mg via ORAL
  Filled 2023-08-04 (×8): qty 2

## 2023-08-04 MED ORDER — HYDROMORPHONE HCL 1 MG/ML IJ SOLN
1.0000 mg | Freq: Once | INTRAMUSCULAR | Status: AC
  Administered 2023-08-04: 1 mg via INTRAVENOUS
  Filled 2023-08-04: qty 1

## 2023-08-04 MED ORDER — FENTANYL CITRATE PF 50 MCG/ML IJ SOSY
50.0000 ug | PREFILLED_SYRINGE | Freq: Once | INTRAMUSCULAR | Status: AC
  Administered 2023-08-04: 50 ug via INTRAVENOUS
  Filled 2023-08-04: qty 1

## 2023-08-04 NOTE — H&P (Signed)
History and Physical    Patient: Vincent Cobb [id number] DOB: 25-Sep-1961 DOA: 08/04/2023 DOS: the patient was seen and examined on 08/04/2023 PCP: Melvyn Neth, NP  Patient coming from: Home  Chief Complaint:  Chief Complaint  Patient presents with   Cellulitis   HPI: Vincent Cobb is a 62 y.o. male with medical history significant of recently diagnosed bladder mass, thyroid mass, HTN, chronic back pain, peripheral venous insufficiency, atrial flutter not on anticoagulation due to hematuria presents with worsening bilateral foot wound and pain.   Pt is a limited history. Reports wounds appeared on his foot since July although significant other says it has just been a month. Has been following wound care in [place] for the past month.   He was evaluated at [facility] on 10/1 for bilateral lower extremity pain with chronic wounds. Had workup with CTA of abdominal aorta with iliofemoral runoff with no significant arterial disease. He was discharged with Clindamycin and Bactrim.   He presents today because of significant burning pain to both legs. Says temperature has been around 100F. He denies hx of diabetes. Has a pack daily tobacco use for 10 years.   On arrival to ED, he was afebrile, tachycardic with heart rate of 120, normotensive on room air.  CBC without leukocytosis, had stable anemia with hemoglobin of 9.9.  BMP notable for elevated creatinine of 1.46 with a prior of 0.86.  CBG of 129.  Sed rate was elevated 45.  Left foot x-ray with mild diffuse soft tissue swelling of the calf and foot.  2 small shallow likely ulcers of the dorsal midfoot. Right foot x-ray with diffuse soft tissue swelling of the calf and foot.  Tiny shallow ulcer at the medial aspect of the midfoot at the level of the proximal metatarsal shaft.  There is ulcer medial to the distal aspect of the proximal phalanx of the great toe.  No definitive cortical erosion to indicate osteomyelitis in both  foot x-rays.  UA was indeterminate due to gross hematuria.  Influenza/RSV/COVID negative.  EKG with atrial tachycardia.   He was administered IV vancomycin, Rocephin and given 1 L of LR bolus in the ED. Review of Systems: As mentioned in the history of present illness. All other systems reviewed and are negative. Past Medical History:  Diagnosis Date   Cancer (HCC)    thyroid and bladder   Hypertension    Neuropathy    Tachycardia    Past Surgical History:  Procedure Laterality Date   BACK SURGERY     CYSTOSCOPY     Social History:  reports that he has been smoking cigarettes. He has never used smokeless tobacco. He reports current alcohol use. He reports that he does not use drugs.  Allergies  Allergen Reactions   Celebrex [Celecoxib] Anaphylaxis   Simvastatin Other (See Comments)    Muscle pain   Vioxx [Rofecoxib]     History reviewed. No pertinent family history.  Prior to Admission medications   Medication Sig Start Date End Date Taking? Authorizing Provider  busPIRone (BUSPAR) 15 MG tablet Take 15 mg by mouth 2 (two) times daily.   Yes [provider]  EPINEPHrine 0.1 MG/0.1ML SOAJ Inject 0.3 mg into the muscle as needed (Anaphylaxis).   Yes [provider]  fluticasone (FLONASE) 50 MCG/ACT nasal spray Place 2 sprays into both nostrils daily as needed for allergies.   Yes [provider]  furosemide (LASIX) 40 MG tablet Take 1.5 tablets by mouth daily.  Yes [provider]  metoprolol succinate (TOPROL-XL) 50 MG 24 hr tablet Take 50 mg by mouth in the morning and at bedtime. Take with or immediately following a meal.   Yes [provider]  Vitamin D, Ergocalciferol, (DRISDOL) 1.25 MG (50000 UNIT) CAPS capsule Take 50,000 Units by mouth once a week. 04/21/23  Yes [provider]  clindamycin (CLEOCIN T) 1 % lotion Apply 1 Application topically 2 (two) times daily. Patient not taking: Reported on 08/04/2023 07/21/23  07/20/24  [provider]  gabapentin (NEURONTIN) 100 MG capsule Take 300 mg by mouth daily. Patient not taking: Reported on 08/04/2023    [provider]    Physical Exam: Vitals:   08/04/23 1845 08/04/23 1930 08/04/23 2045 08/04/23 2115  BP:  (!) 137/108 (!) 158/85 (!) 150/98  Pulse:  93 62 (!) 131  Resp: 18 18    Temp: 97.8 F (36.6 C)     TempSrc: Oral     SpO2:  92% (!) 88% 94%  Weight:      Height:       Constitutional: NAD, standing up at the end of his bed and pacing in place and restless due to pain to LE Eyes: lids and conjunctivae normal ENMT: Mucous membranes are moist.  Neck: normal, supple Respiratory: clear to auscultation bilaterally, no wheezing, no crackles. Normal respiratory effort. No accessory muscle use.  Cardiovascular: Regular rate and rhythm, no murmurs / rubs / gallops. No extremity edema. 2+ pedal pulses.  Abdomen: no tenderness, soft, no masses palpable  Musculoskeletal: no clubbing / cyanosis. No joint deformity upper and lower extremities.  Normal muscle tone.  Skin: scattered ulcerations of bilateral dorsal foot and medial malleolus. Worse to dorsal 2nd and 3rd digit on both foot with purulent material. There is surround necrosis to all ulcers and spreading erythema up lower extremity. Marland Kitchen  Ulcers also noted to right pre-tibial and mid-calf region.       Neurologic: CN 2-12 grossly intact. Sensation intact Psychiatric: Normal judgment and insight. Alert and oriented x 3. Normal mood.   Data Reviewed:  See HPI  Assessment and Plan: * Wound cellulitis -scattered purulent ulcerations to bilateral dorsal foot, medial ankle and up to calf definitely concerning for osteomyelitis - Had workup with CTA of abdominal aorta with iliofemoral runoff with no significant arterial disease at [facility] on 07/20/23 -MRI of bilateral foot ordered -continue IV vancomycin and cefepime -he is having significant pain and in discomfort. PRN  oxycodone and IV Dilaudid -wound consult  Chronic back pain -continue baclofen, gabapentin  Tobacco use -a pack daily use. Pt is willing to quit. Nicotine patch ordered  Atrial flutter (HCC) -appears to be in atrial tachycardia on EKG  -Not on anticoagulation due to chronic hematuria from bladder mass -continue metoprolol  Bladder mass -follows by urology in [place]. Biopsy on hold since pt found to have atrial flutter with RVR during his cardiac pre-op evaluate. Cardiology is planning to do cardioversion once he is able to get back on anticoagulation.   Normocytic anemia -Stable at 9.9. Likely due to chronic blood loss- has chronic hematuria from recently diagnosed bladder mass  AKI (acute kidney injury) (HCC) -BMP notable for elevated creatinine of 1.46 with a prior of 0.86. -continue on IV fluids -hold nephrotoxic agent. Holding home Lasix.      Advance Care Planning: Full  Consults: none  Family Communication: significant other at bedside  Severity of Illness: The appropriate patient status for this patient is  INPATIENT. Inpatient status is judged to be reasonable and necessary in order to provide the required intensity of service to ensure the patient's safety. The patient's presenting symptoms, physical exam findings, and initial radiographic and laboratory data in the context of their chronic comorbidities is felt to place them at high risk for further clinical deterioration. Furthermore, it is not anticipated that the patient will be medically stable for discharge from the hospital within 2 midnights of admission.   * I certify that at the point of admission it is my clinical judgment that the patient will require inpatient hospital care spanning beyond 2 midnights from the point of admission due to high intensity of service, high risk for further deterioration and high frequency of surveillance required.*  Author: Anselm Jungling, DO 08/04/2023 10:37 PM  For on call  review [email or web address].

## 2023-08-04 NOTE — ED Notes (Signed)
Patient transported to X-ray 

## 2023-08-04 NOTE — ED Provider Notes (Signed)
[place] EMERGENCY DEPARTMENT AT [facility] Provider Note   CSN: [id number] Arrival date & time: 08/04/23  1311     History  Chief Complaint  Patient presents with   Cellulitis    Vincent Cobb is a 62 y.o. male history of hypertension, peripheral venous insufficiency, heart failure with last echo being August 22 EF 55-60% with trivial mitral regurg/tricuspid regurg, bladder and thyroid cancer not on chemo/radiation presented with worsening cellulitis has been present since May 21, 2023.  Patient states it became extremely painful is beginning of October when he went to the ER in [place] and was given clindamycin and Bactrim which she has been taking however this has not been helping.  Since August patient has been seen at wound clinic every 9 days and states her not been any improvement.  Patient went to go see the general surgeon that was referred to them when he was at [facility] earlier this month and was directed to go to the ER as he would likely need admission.  The general surgeon patient saw was Dr.Straughan.  Family is present to assist in history and states that patient's been having fevers in the 100s at home.  With this cellulitis patient is been having nausea and vomiting as well.  Patient states that his feet are constantly burning and that the gabapentin he is on at home is not helping him.    Home Medications Prior to Admission medications   Medication Sig Start Date End Date Taking? Authorizing Provider  busPIRone (BUSPAR) 15 MG tablet Take 15 mg by mouth 2 (two) times daily.   Yes [provider]  EPINEPHrine 0.1 MG/0.1ML SOAJ Inject 0.3 mg into the muscle as needed (Anaphylaxis).   Yes [provider]  fluticasone (FLONASE) 50 MCG/ACT nasal spray Place 2 sprays into both nostrils daily as needed for allergies.   Yes [provider]  furosemide (LASIX) 40 MG tablet Take 1.5 tablets by mouth daily.   Yes [provider]  metoprolol succinate (TOPROL-XL) 50 MG 24 hr tablet Take 50 mg by mouth in the morning and at bedtime. Take with or immediately following a meal.   Yes [provider]  Vitamin D, Ergocalciferol, (DRISDOL) 1.25 MG (50000 UNIT) CAPS capsule Take 50,000 Units by mouth once a week. 04/21/23  Yes [provider]  clindamycin (CLEOCIN T) 1 % lotion Apply 1 Application topically 2 (two) times daily. Patient not taking: Reported on 08/04/2023 07/21/23 07/20/24  [provider]  gabapentin (NEURONTIN) 100 MG capsule Take 300 mg by mouth daily. Patient not taking: Reported on 08/04/2023    [provider]      Allergies    Celebrex [celecoxib], Simvastatin, and Vioxx [rofecoxib]    Review of Systems   Review of Systems  Physical Exam Updated Vital Signs BP (!) 150/98   Pulse (!) 131   Temp 97.8 F (36.6 C) (Oral)   Resp 18   Ht 6\' 2"  (1.88 m)   Wt 112 kg   SpO2 94%   BMI 31.71 kg/m  Physical Exam Constitutional:      General: He is in acute distress.  Cardiovascular:     Rate and Rhythm: Regular rhythm. Tachycardia present.     Heart sounds: Normal heart sounds.     Comments: Unable to palpate dorsalis pedal pulses due to patient's pain however Doppler does show good pulses that are tachycardic Pulmonary:     Effort: Pulmonary effort is normal. No respiratory  distress.     Breath sounds: Normal breath sounds.  Abdominal:     Palpations: Abdomen is soft.     Tenderness: There is no abdominal tenderness. There is no guarding or rebound.  Musculoskeletal:     Comments: Bilateral 1+ pitting edema that is warm to palpation  Skin:    General: Skin is warm and dry.     Capillary Refill: Capillary refill takes less than 2 seconds.     Comments: Multiple ulcers with purulent discharge with some ulcers having necrotic tissue surrounding it  Neurological:     Mental Status: He is alert and oriented to person, place, and time.     Comments: Sensation  intact distally        ED Results / Procedures / Treatments   Labs (all labs ordered are listed, but only abnormal results are displayed) Labs Reviewed  COMPREHENSIVE METABOLIC PANEL - Abnormal; Notable for the following components:      Result Value   Glucose, Bld 129 (*)    Creatinine, Ser 1.46 (*)    GFR, Estimated 54 (*)    All other components within normal limits  CBC WITH DIFFERENTIAL/PLATELET - Abnormal; Notable for the following components:   RBC 3.25 (*)    Hemoglobin 9.9 (*)    HCT 30.4 (*)    Neutro Abs 8.0 (*)    All other components within normal limits  URINALYSIS, W/ REFLEX TO CULTURE (INFECTION SUSPECTED) - Abnormal; Notable for the following components:   Color, Urine RED (*)    APPearance TURBID (*)    Hgb urine dipstick   (*)    Value: TEST NOT REPORTED DUE TO COLOR INTERFERENCE OF URINE PIGMENT   Bilirubin Urine   (*)    Value: TEST NOT REPORTED DUE TO COLOR INTERFERENCE OF URINE PIGMENT   Ketones, ur   (*)    Value: TEST NOT REPORTED DUE TO COLOR INTERFERENCE OF URINE PIGMENT   Protein, ur   (*)    Value: TEST NOT REPORTED DUE TO COLOR INTERFERENCE OF URINE PIGMENT   Nitrite   (*)    Value: TEST NOT REPORTED DUE TO COLOR INTERFERENCE OF URINE PIGMENT   Leukocytes,Ua   (*)    Value: TEST NOT REPORTED DUE TO COLOR INTERFERENCE OF URINE PIGMENT   Bacteria, UA RARE (*)    All other components within normal limits  PROTIME-INR - Abnormal; Notable for the following components:   Prothrombin Time 15.3 (*)    All other components within normal limits  SEDIMENTATION RATE - Abnormal; Notable for the following components:   Sed Rate 48 (*)    All other components within normal limits  CULTURE, BLOOD (ROUTINE X 2)  RESP PANEL BY RT-PCR (RSV, FLU A&B, COVID)  RVPGX2  CULTURE, BLOOD (ROUTINE X 2)  APTT  LIPASE, BLOOD  HIV ANTIBODY (ROUTINE TESTING W REFLEX)  CBC  BASIC METABOLIC PANEL  I-STAT CG4 LACTIC ACID, ED  I-STAT CG4 LACTIC ACID, ED    EKG EKG  Interpretation Date/Time:  Wednesday August 04 2023 16:04:33 EDT Ventricular Rate:  124 PR Interval:  144 QRS Duration:  103 QT Interval:  324 QTC Calculation: 466 R Axis:   -9  Text Interpretation: Ectopic atrial tachycardia or atrial flutter Low voltage, precordial leads Probable anterolateral infarct, old Repol abnrm suggests ischemia, diffuse leads Confirmed by Alvino Blood ([id number]) on 08/04/2023 4:44:13 PM  Radiology DG Chest 1 View  Result Date: 08/04/2023 CLINICAL DATA:  61 year old male with lower extremity  cellulitis. Pain. Chills, nausea vomiting. EXAM: CHEST  1 VIEW COMPARISON:  Portable chest 05/19/2005. FINDINGS: Portable AP semi upright view at 1537 hours. Lung volumes and mediastinal contours remain within normal limits. Visualized tracheal air column is within normal limits. Allowing for portable technique the lungs are clear. No pneumothorax or pleural effusion. Negative visible bowel gas. No acute osseous abnormality identified. IMPRESSION: Negative portable chest. Electronically Signed   By: Odessa Fleming M.D.   On: 08/04/2023 18:28   DG Foot Complete Right  Result Date: 08/04/2023 CLINICAL DATA:  Cellulitis. Infection has been present for 3 weeks. Pain. EXAM: RIGHT FOOT COMPLETE - 3+ VIEW COMPARISON:  None Available. FINDINGS: There is diffuse soft tissue swelling of the calf and foot. Diffuse decreased bone mineralization. Moderate plantar and tiny posterior calcaneal heel spurs. Mild dorsal talonavicular, navicular-cuneiform, and tarsometatarsal degenerative osteophytes. There are multiple lucencies within the soft tissues of the dorsal ankle and midfoot, likely subcutaneous edema. Possible tiny shallow ulcer at the medial aspect of the midfoot at the level of the proximal metatarsal shaft best seen on oblique view. On oblique view there also appears to be an ulcer measuring up to approximately 16 x 10 mm just medial to the distal aspect of the proximal phalanx of the great  toe. No cortical erosion is seen. IMPRESSION: 1. Diffuse soft tissue swelling of the calf and foot. 2. Possible tiny shallow ulcer at the medial aspect of the midfoot at the level of the proximal metatarsal shaft. 3. Ulcer measuring up to approximately 16 x 10 mm just medial to the distal aspect of the proximal phalanx of the great toe. 4. No definite cortical erosion to indicate radiographic evidence of osteomyelitis. Electronically Signed   By: Neita Garnet M.D.   On: 08/04/2023 17:42   DG Foot Complete Left  Result Date: 08/04/2023 CLINICAL DATA:  Cellulitis. Infection has been present for 3 weeks but worsened recently. Cellulitis. EXAM: LEFT FOOT - COMPLETE 3+ VIEW COMPARISON:  None Available. FINDINGS: There is mild diffuse soft tissue swelling of the calf and foot. Small plantar calcaneal heel spur. There are two small shallow likely ulcers of the dorsal midfoot measuring only up to 2 mm in craniocaudal depth, seen on lateral view. No cortical erosion is seen.  No subcutaneous air. Mild great toe metatarsophalangeal joint space narrowing and peripheral osteophytosis. IMPRESSION: 1. Mild diffuse soft tissue swelling of the calf and foot. 2. Two small shallow likely ulcers of the dorsal midfoot. 3. No cortical erosion to indicate radiographic evidence of osteomyelitis. Electronically Signed   By: Neita Garnet M.D.   On: 08/04/2023 16:49    Procedures .Critical Care  Performed by: Netta Corrigan, PA-C Authorized by: Netta Corrigan, PA-C   Critical care provider statement:    Critical care time (minutes):  50   Critical care time was exclusive of:  Separately billable procedures and treating other patients   Critical care was necessary to treat or prevent imminent or life-threatening deterioration of the following conditions:  Sepsis   Critical care was time spent personally by me on the following activities:  Blood draw for specimens, development of treatment plan with patient or surrogate,  discussions with consultants, evaluation of patient's response to treatment, examination of patient, obtaining history from patient or surrogate, review of old charts, re-evaluation of patient's condition, pulse oximetry, ordering and review of laboratory studies, ordering and review of radiographic studies and ordering and performing treatments and interventions   I assumed direction of critical  care for this patient from another provider in my specialty: no     Care discussed with: admitting provider       Medications Ordered in ED Medications  influenza vac split trivalent PF (FLULAVAL) injection 0.5 mL (has no administration in time range)  gabapentin (NEURONTIN) capsule 300 mg (300 mg Oral Given 08/04/23 2218)  busPIRone (BUSPAR) tablet 15 mg (15 mg Oral Given 08/04/23 2216)  baclofen (LIORESAL) tablet 10 mg (10 mg Oral Given 08/04/23 2215)  ezetimibe (ZETIA) tablet 10 mg (has no administration in time range)  tamsulosin (FLOMAX) capsule 0.4 mg (has no administration in time range)  metoprolol succinate (TOPROL-XL) 24 hr tablet 100 mg (has no administration in time range)  pantoprazole (PROTONIX) EC tablet 80 mg (has no administration in time range)  acetaminophen (TYLENOL) tablet 650 mg (has no administration in time range)  oxyCODONE-acetaminophen (PERCOCET/ROXICET) 5-325 MG per tablet 1-2 tablet (has no administration in time range)  HYDROmorphone (DILAUDID) injection 1 mg (has no administration in time range)  ceFEPIme (MAXIPIME) 2 g in sodium chloride 0.9 % 100 mL IVPB (2 g Intravenous New Bag/Given 08/04/23 2236)  nicotine (NICODERM CQ - dosed in mg/24 hours) patch 14 mg (has no administration in time range)  lactated ringers infusion ( Intravenous New Bag/Given 08/04/23 2246)  ceFEPIme (MAXIPIME) 2 g in sodium chloride 0.9 % 100 mL IVPB (has no administration in time range)  vancomycin (VANCOREADY) IVPB 750 mg/150 mL (has no administration in time range)  fentaNYL (SUBLIMAZE)  injection 50 mcg (50 mcg Intravenous Given 08/04/23 1453)  lactated ringers bolus 1,000 mL (0 mLs Intravenous Stopped 08/04/23 1647)  cefTRIAXone (ROCEPHIN) 2 g in sodium chloride 0.9 % 100 mL IVPB (0 g Intravenous Stopped 08/04/23 1615)  HYDROmorphone (DILAUDID) injection 1 mg (1 mg Intravenous Given 08/04/23 1525)  vancomycin (VANCOCIN) 2,500 mg in sodium chloride 0.9 % 500 mL IVPB (0 mg Intravenous Stopped 08/04/23 1836)  gabapentin (NEURONTIN) capsule 100 mg (100 mg Oral Given 08/04/23 1935)  fentaNYL (SUBLIMAZE) injection 25 mcg (25 mcg Intravenous Given 08/04/23 2213)    ED Course/ Medical Decision Making/ A&P                                 Medical Decision Making Amount and/or Complexity of Data Reviewed Labs: ordered. Radiology: ordered. ECG/medicine tests: ordered.  Risk Prescription drug management. Decision regarding hospitalization.   Vincent Cobb 62 y.o. presented today for sepsis.  Working DDx that I considered at this time includes, but not limited to, sepsis, bacteremia, UTI, pneumonia, meningitis/encephalitis, cellulitis, ACS, myocarditis, acidosis, dehydration, electrolyte abnormalities.  R/o DDx: UTI, pneumonia, meningitis/encephalitis, ACS, myocarditis, acidosis, electrolyte abnormalities: These are considered less likely due to history of present illness, physical exam, labs/imaging findings.  Review of prior external notes: 07/20/2023 ED  Unique Tests and My Interpretation: CBC: Unremarkable CMP: Unremarkable Lactic acid: 1.3 Lipase: Pending UA: Too bloody to read, most likely secondary to bladder cancer Chest x-ray: No acute cardiopulmonary pathology Left foot x-ray: Multiple ulcers but no osteomyelitis Right foot x-ray: Multiple ulcers but no osteomyelitis aPTT: Unremarkable PT/INR: Unremarkable Blood cultures: Pending Respiratory panel: Negative ESR: 48 EKG: Atrial tachycardia 124 bpm, no ST elevations or depressions noted  Discussion with  Independent Historian:  Family  Discussion of Management of Tests:  Tu, MD Hospitalist  Risk: High: Hospitalization  Risk Stratification Score: none  Staffed with Suezanne Jacquet, MD  Plan: On exam patient was septic on  arrival.  On exam patient in acute distress was noted be tachycardic 124.  Patient's gauze was removed which does show purulent ulcers with necrotic tissue.  I was unable to palpate pulses due to swelling and patient not tolerating me palpating his feet due to pain however he does have capillary fill less than 2 seconds and can feel me touch.  Patient is unable to wiggle his toes or ankle due to swelling and pain.  Will get Doppler.  Patient was given fentanyl 20 minutes before I arrived however when I spoke to the patient patient was in acute distress and saying that the fentanyl did not help and so we will give Dilaudid.  Patient's been taking clindamycin and Bactrim outpatient which has not been helping and seeing wound clinic which is also not been helping.  Family reports fevers and so given presentation reported fevers will initiate sepsis protocol.  X-rays obtained to look for osteomyelitis or soft tissue gases.  Anticipate admission due to suspected sepsis along with possible necrotizing cellulitis.  Patient is not hypotensive and lactic is not greater than 4 and thus only 1 L of LR was ordered as at this time patient does not need 30 cc/kg of fluid along with having history of heart failure do not want to flood his lungs either.  Will withhold the rest of the 30 cc per kg at this time.  Doppler does show patient has bilateral pulses that are good.  X-rays do not show osteomyelitis but do show multiple ulcers of the skin.  Chest x-ray with my interpretation does not appear to have any acute pathology.  ESR is elevated however this most likely due to cellulitis.  Patient does have AKI as well but given patient's history of heart failure and already receiving 1L will wait to speak with  the hospitalist if they want to give more fluids.  With the rest of the labs being complete will admit to the hospitalist.  I spoke to the hospitalist and patient was accepted for admission.  Patient stable for admission.  This chart was dictated using voice recognition software.  Despite best efforts to proofread,  errors can occur which can change the documentation meaning.         Final Clinical Impression(s) / ED Diagnoses Final diagnoses:  Cellulitis of lower extremity, unspecified laterality  Skin ulcers of both feet (HCC)  AKI (acute kidney injury) [facility])    Rx / DC Orders ED Discharge Orders     None         Remi Deter 08/04/23 2251    Lonell Grandchild, MD 08/04/23 [phone]

## 2023-08-04 NOTE — Assessment & Plan Note (Signed)
-  follows by urology in [place]. Biopsy on hold since pt found to have atrial flutter with RVR during his cardiac pre-op evaluate. Cardiology is planning to do cardioversion once he is able to get back on anticoagulation.

## 2023-08-04 NOTE — Assessment & Plan Note (Signed)
-  a pack daily use. Pt is willing to quit. Nicotine patch ordered

## 2023-08-04 NOTE — Progress Notes (Signed)
Elink following code sepsis

## 2023-08-04 NOTE — ED Triage Notes (Signed)
Pt sent from [facility] in [place] for cellulitis of bilateral lower legs and feet.  Infection has been present for 3 weeks but worsened recently.  Pt has 9/10 pain. Pt has had chills, N/V.  Pt also has thyroid and bladder cancer dx but hasn't had yet.  Surgeries for biopsies have been delayed d/t infections.

## 2023-08-04 NOTE — ED Notes (Addendum)
Doppler done on bilateral feet, pulses present and marked. Pt continues to pace around the room in pain but does allow for all testing to easily be done.

## 2023-08-04 NOTE — Progress Notes (Signed)
Patient attempted MRI but due to pain and burning, not able to hold still to complete exams.  Patient was given meds while in MRI with no relief.

## 2023-08-04 NOTE — Assessment & Plan Note (Addendum)
-  appears to be in atrial tachycardia on EKG  -Not on anticoagulation due to chronic hematuria from bladder mass -continue metoprolol

## 2023-08-04 NOTE — Progress Notes (Signed)
ED Pharmacy Antibiotic Sign Off An antibiotic consult was received from an ED provider for vancomycin per pharmacy dosing for sepsis 2/2 cellulitis. A chart review was completed to assess appropriateness.  The following one time order(s) were placed per pharmacy consult:   vancomycin 2500 mg IV x 1 dose   Further antibiotic and/or antibiotic pharmacy consults should be ordered by the admitting provider if indicated.   Thank you for allowing pharmacy to be a part of this patient's care.   Stephenie Acres, PharmD PGY1 Pharmacy Resident 08/04/2023 3:15 PM

## 2023-08-04 NOTE — Progress Notes (Signed)
Pharmacy Antibiotic Note  Vincent Cobb is a 62 y.o. male PMH of recently diagnosed bladder mass, thyroid mass, HTN, chronic back pain, peripheral venous insufficiency, atrial flutter not on anticoagulation due to hematuria was admitted on 08/04/2023 with cellulitis.  Pharmacy has been consulted for cefepime and vancomycin dosing.  WBC 10.3, afebrile, HR 131, BP 150/98, RR 18 Scr 1.46, elevated Vancomycin 2500 mg IV x1 given in ED   Plan: Cefepime 2g IV every 8 hours Vancomycin 750mg  IV every 12 hours >Goal AUC 400-600 >eAUC 481, eVT 14.9 (using Scr 1.46, IBW, Vd 0.5) Monitor CBC, renal function, cultures, and signs of clinical improvement Obtain vancomycin levels as indicated per pharmacy protocol Follow up MRI results for osteomyelitis diagnosis Deescalate as able    Height: 6\' 2"  (188 cm) Weight: 112 kg (247 lb) IBW/kg (Calculated) : 82.2  Temp (24hrs), Avg:97.8 F (36.6 C), Min:97.8 F (36.6 C), Max:97.8 F (36.6 C)  Recent Labs  Lab 08/04/23 1401 08/04/23 1411 08/04/23 1514  WBC 10.3  --   --   CREATININE 1.46*  --   --   LATICACIDVEN  --  1.0 1.3    Estimated Creatinine Clearance: 70.7 mL/min (A) (by C-G formula based on SCr of 1.46 mg/dL (H)).    Allergies  Allergen Reactions   Celebrex [Celecoxib] Anaphylaxis   Simvastatin Other (See Comments)    Muscle pain   Vioxx [Rofecoxib]     Antimicrobials this admission: 10/16 vancomycin >> 10/16 cefepime >>   Microbiology results: 10/16 BCx: pending 10/16 Respiratory panel: negative  Thank you for allowing pharmacy to be a part of this patient's care.   Stephenie Acres, PharmD PGY1 Pharmacy Resident 08/04/2023 10:11 PM

## 2023-08-04 NOTE — ED Notes (Signed)
PT conintues to pace in the room stating the "pain is too bad to sit still" pt states the "burning sensation is the worst part"

## 2023-08-04 NOTE — Assessment & Plan Note (Addendum)
-  scattered purulent ulcerations to bilateral dorsal foot, medial ankle and up to calf definitely concerning for osteomyelitis - Had workup with CTA of abdominal aorta with iliofemoral runoff with no significant arterial disease at [facility] on 07/20/23 -MRI of bilateral foot ordered -continue IV vancomycin and cefepime -he is having significant pain and in discomfort. PRN oxycodone and IV Dilaudid -wound consult

## 2023-08-04 NOTE — ED Notes (Signed)
ED TO INPATIENT HANDOFF REPORT  ED Nurse Name and Phone #: Tamatha Gadbois [phone]  S Name/Age/Gender Vincent Cobb 62 y.o. male Room/Bed: 034C/034C  Code Status   Code Status: Full Code  Home/SNF/Other Home Patient oriented to: self, place, time, and situation Is this baseline? Yes   Triage Complete: Triage complete  Chief Complaint Wound cellulitis [L03.90]  Triage Note Pt sent from [facility] in [place] for cellulitis of bilateral lower legs and feet.  Infection has been present for 3 weeks but worsened recently.  Pt has 9/10 pain. Pt has had chills, N/V.  Pt also has thyroid and bladder cancer dx but hasn't had yet.  Surgeries for biopsies have been delayed d/t infections.    Allergies Allergies  Allergen Reactions   Celebrex [Celecoxib] Anaphylaxis   Simvastatin Other (See Comments)    Muscle pain   Vioxx [Rofecoxib]     Level of Care/Admitting Diagnosis ED Disposition     ED Disposition  Admit   Condition  --   Comment  Hospital Area: [facility] [[id number]]  Level of Care: Telemetry Medical [104]  May admit patient to [facility] or [facility] if equivalent level of care is available:: No  Covid Evaluation: Asymptomatic - no recent exposure (last 10 days) testing not required  Diagnosis: Wound cellulitis [782956]  Admitting Physician: Anselm Jungling [[id number]]  Attending Physician: Anselm Jungling [[id number]]  Certification:: I certify this patient will need inpatient services for at least 2 midnights  Expected Medical Readiness: 08/09/2023          B Medical/Surgery History Past Medical History:  Diagnosis Date   Cancer (HCC)    thyroid and bladder   Hypertension    Neuropathy    Tachycardia    Past Surgical History:  Procedure Laterality Date   BACK SURGERY     CYSTOSCOPY       A IV Location/Drains/Wounds Patient Lines/Drains/Airways Status     Active Line/Drains/Airways     Name Placement date Placement time Site Days   Peripheral  IV 08/04/23 20 G Anterior;Distal;Left;Upper Arm 08/04/23  1450  Arm  less than 1   Peripheral IV 08/04/23 18 G Anterior;Distal;Right;Upper Arm 08/04/23  1552  Arm  less than 1            Intake/Output Last 24 hours  Intake/Output Summary (Last 24 hours) at 08/04/2023 2247 Last data filed at 08/04/2023 2244 Gross per 24 hour  Intake 872.94 ml  Output --  Net 872.94 ml    Labs/Imaging Results for orders placed or performed during the hospital encounter of 08/04/23 (from the past 48 hour(s))  Blood culture (routine x 2)     Status: None (Preliminary result)   Collection Time: 08/04/23  1:57 PM   Specimen: BLOOD RIGHT FOREARM  Result Value Ref Range   Specimen Description BLOOD RIGHT FOREARM    Special Requests      BOTTLES DRAWN AEROBIC AND ANAEROBIC Blood Culture results may not be optimal due to an excessive volume of blood received in culture bottles Performed at [facility], [address], [place], [place] [address]    Culture PENDING    Report Status PENDING   Comprehensive metabolic panel     Status: Abnormal   Collection Time: 08/04/23  2:01 PM  Result Value Ref Range   Sodium 137 135 - 145 mmol/L   Potassium 4.1 3.5 - 5.1 mmol/L   Chloride 106 98 - 111 mmol/L   CO2  22 22 - 32 mmol/L   Glucose, Bld 129 (H) 70 - 99 mg/dL    Comment: Glucose reference range applies only to samples taken after fasting for at least 8 hours.   BUN 17 8 - 23 mg/dL   Creatinine, Ser 1.61 (H) 0.61 - 1.24 mg/dL   Calcium 9.4 8.9 - 09.6 mg/dL   Total Protein 8.0 6.5 - 8.1 g/dL   Albumin 3.5 3.5 - 5.0 g/dL   AST 19 15 - 41 U/L   ALT 12 0 - 44 U/L   Alkaline Phosphatase 105 38 - 126 U/L   Total Bilirubin 0.6 0.3 - 1.2 mg/dL   GFR, Estimated 54 (L) >60 mL/min    Comment: (NOTE) Calculated using the CKD-EPI Creatinine Equation (2021)    Anion gap 9 5 - 15    Comment: Performed at [facility], [address] [place], [place] [address]  CBC with Differential     Status:  Abnormal   Collection Time: 08/04/23  2:01 PM  Result Value Ref Range   WBC 10.3 4.0 - 10.5 K/uL   RBC 3.25 (L) 4.22 - 5.81 MIL/uL   Hemoglobin 9.9 (L) 13.0 - 17.0 g/dL   HCT 98.1 (L) 19.1 - 47.8 %   MCV 93.5 80.0 - 100.0 fL   MCH 30.5 26.0 - 34.0 pg   MCHC 32.6 30.0 - 36.0 g/dL   RDW 29.5 62.1 - 30.8 %   Platelets 243 150 - 400 K/uL   nRBC 0.0 0.0 - 0.2 %   Neutrophils Relative % 78 %   Neutro Abs 8.0 (H) 1.7 - 7.7 K/uL   Lymphocytes Relative 12 %   Lymphs Abs 1.2 0.7 - 4.0 K/uL   Monocytes Relative 6 %   Monocytes Absolute 0.6 0.1 - 1.0 K/uL   Eosinophils Relative 3 %   Eosinophils Absolute 0.3 0.0 - 0.5 K/uL   Basophils Relative 1 %   Basophils Absolute 0.1 0.0 - 0.1 K/uL   Immature Granulocytes 0 %   Abs Immature Granulocytes 0.04 0.00 - 0.07 K/uL    Comment: Performed at [facility], [address], [place], [place] [address]  Urinalysis, w/ Reflex to Culture (Infection Suspected) -Urine, Clean Catch     Status: Abnormal   Collection Time: 08/04/23  2:01 PM  Result Value Ref Range   Specimen Source URINE, CLEAN CATCH    Color, Urine RED (A) YELLOW    Comment: BIOCHEMICALS MAY BE AFFECTED BY COLOR   APPearance TURBID (A) CLEAR   Specific Gravity, Urine  1.005 - 1.030    TEST NOT REPORTED DUE TO COLOR INTERFERENCE OF URINE PIGMENT   pH  5.0 - 8.0    TEST NOT REPORTED DUE TO COLOR INTERFERENCE OF URINE PIGMENT   Glucose, UA NEGATIVE NEGATIVE mg/dL   Hgb urine dipstick (A) NEGATIVE    TEST NOT REPORTED DUE TO COLOR INTERFERENCE OF URINE PIGMENT   Bilirubin Urine (A) NEGATIVE    TEST NOT REPORTED DUE TO COLOR INTERFERENCE OF URINE PIGMENT   Ketones, ur (A) NEGATIVE mg/dL    TEST NOT REPORTED DUE TO COLOR INTERFERENCE OF URINE PIGMENT   Protein, ur (A) NEGATIVE mg/dL    TEST NOT REPORTED DUE TO COLOR INTERFERENCE OF URINE PIGMENT   Nitrite (A) NEGATIVE    TEST NOT REPORTED DUE TO COLOR INTERFERENCE OF URINE PIGMENT   Leukocytes,Ua (A) NEGATIVE    TEST NOT  REPORTED DUE TO COLOR INTERFERENCE OF URINE PIGMENT  Squamous Epithelial / HPF 0-5 0 - 5 /HPF   WBC, UA 6-10 0 - 5 WBC/hpf    Comment: Reflex urine culture not performed if WBC <=10, OR if Squamous epithelial cells >5. If Squamous epithelial cells >5, suggest recollection.   RBC / HPF >50 0 - 5 RBC/hpf   Bacteria, UA RARE (A) NONE SEEN    Comment: Performed at [facility], [address] [place], [place] [address]  I-Stat Lactic Acid, ED     Status: None   Collection Time: 08/04/23  2:11 PM  Result Value Ref Range   Lactic Acid, Venous 1.0 0.5 - 1.9 mmol/L  I-Stat Lactic Acid, ED     Status: None   Collection Time: 08/04/23  3:14 PM  Result Value Ref Range   Lactic Acid, Venous 1.3 0.5 - 1.9 mmol/L  Resp panel by RT-PCR (RSV, Flu A&B, Covid) Anterior Nasal Swab     Status: None   Collection Time: 08/04/23  3:33 PM   Specimen: Anterior Nasal Swab  Result Value Ref Range   SARS Coronavirus 2 by RT PCR NEGATIVE NEGATIVE   Influenza A by PCR NEGATIVE NEGATIVE   Influenza B by PCR NEGATIVE NEGATIVE    Comment: (NOTE) The Xpert Xpress SARS-CoV-2/FLU/RSV plus assay is intended as an aid in the diagnosis of influenza from Nasopharyngeal swab specimens and should not be used as a sole basis for treatment. Nasal washings and aspirates are unacceptable for Xpert Xpress SARS-CoV-2/FLU/RSV testing.  Fact Sheet for Patients: BloggerCourse.com  Fact Sheet for Healthcare Providers: SeriousBroker.it  This test is not yet approved or cleared by the [place] FDA and has been authorized for detection and/or diagnosis of SARS-CoV-2 by FDA under an Emergency Use Authorization (EUA). This EUA will remain in effect (meaning this test can be used) for the duration of the COVID-19 declaration under Section 564(b)(1) of the Act, 21 U.S.C. section 360bbb-3(b)(1), unless the authorization is terminated or revoked.     Resp Syncytial Virus  by PCR NEGATIVE NEGATIVE    Comment: (NOTE) Fact Sheet for Patients: BloggerCourse.com  Fact Sheet for Healthcare Providers: SeriousBroker.it  This test is not yet approved or cleared by the [place] FDA and has been authorized for detection and/or diagnosis of SARS-CoV-2 by FDA under an Emergency Use Authorization (EUA). This EUA will remain in effect (meaning this test can be used) for the duration of the COVID-19 declaration under Section 564(b)(1) of the Act, 21 U.S.C. section 360bbb-3(b)(1), unless the authorization is terminated or revoked.  Performed at [facility], [address], [place], [place] [address]   Protime-INR     Status: Abnormal   Collection Time: 08/04/23  3:33 PM  Result Value Ref Range   Prothrombin Time 15.3 (H) 11.4 - 15.2 seconds   INR 1.2 0.8 - 1.2    Comment: (NOTE) INR goal varies based on device and disease states. Performed at [facility], [address], [place], [place] [address]   APTT     Status: None   Collection Time: 08/04/23  3:33 PM  Result Value Ref Range   aPTT 29 24 - 36 seconds    Comment: Performed at [facility], [address], [place], [place] [address]  Sedimentation rate     Status: Abnormal   Collection Time: 08/04/23  3:33 PM  Result Value Ref Range   Sed Rate 48 (H) 0 - 16 mm/hr    Comment: Performed at [facility], [address] [place], [place] [address]   DG Chest 1 View  Result Date: 08/04/2023 CLINICAL DATA:  62 year old male with lower extremity cellulitis. Pain. Chills, nausea vomiting. EXAM: CHEST  1 VIEW COMPARISON:  Portable chest 05/19/2005. FINDINGS: Portable AP semi upright view at 1537 hours. Lung volumes and mediastinal contours remain within normal limits. Visualized tracheal air column is within normal limits. Allowing for portable technique the lungs are clear. No pneumothorax or pleural effusion. Negative visible bowel  gas. No acute osseous abnormality identified. IMPRESSION: Negative portable chest. Electronically Signed   By: Odessa Fleming M.D.   On: 08/04/2023 18:28   DG Foot Complete Right  Result Date: 08/04/2023 CLINICAL DATA:  Cellulitis. Infection has been present for 3 weeks. Pain. EXAM: RIGHT FOOT COMPLETE - 3+ VIEW COMPARISON:  None Available. FINDINGS: There is diffuse soft tissue swelling of the calf and foot. Diffuse decreased bone mineralization. Moderate plantar and tiny posterior calcaneal heel spurs. Mild dorsal talonavicular, navicular-cuneiform, and tarsometatarsal degenerative osteophytes. There are multiple lucencies within the soft tissues of the dorsal ankle and midfoot, likely subcutaneous edema. Possible tiny shallow ulcer at the medial aspect of the midfoot at the level of the proximal metatarsal shaft best seen on oblique view. On oblique view there also appears to be an ulcer measuring up to approximately 16 x 10 mm just medial to the distal aspect of the proximal phalanx of the great toe. No cortical erosion is seen. IMPRESSION: 1. Diffuse soft tissue swelling of the calf and foot. 2. Possible tiny shallow ulcer at the medial aspect of the midfoot at the level of the proximal metatarsal shaft. 3. Ulcer measuring up to approximately 16 x 10 mm just medial to the distal aspect of the proximal phalanx of the great toe. 4. No definite cortical erosion to indicate radiographic evidence of osteomyelitis. Electronically Signed   By: Neita Garnet M.D.   On: 08/04/2023 17:42   DG Foot Complete Left  Result Date: 08/04/2023 CLINICAL DATA:  Cellulitis. Infection has been present for 3 weeks but worsened recently. Cellulitis. EXAM: LEFT FOOT - COMPLETE 3+ VIEW COMPARISON:  None Available. FINDINGS: There is mild diffuse soft tissue swelling of the calf and foot. Small plantar calcaneal heel spur. There are two small shallow likely ulcers of the dorsal midfoot measuring only up to 2 mm in craniocaudal depth,  seen on lateral view. No cortical erosion is seen.  No subcutaneous air. Mild great toe metatarsophalangeal joint space narrowing and peripheral osteophytosis. IMPRESSION: 1. Mild diffuse soft tissue swelling of the calf and foot. 2. Two small shallow likely ulcers of the dorsal midfoot. 3. No cortical erosion to indicate radiographic evidence of osteomyelitis. Electronically Signed   By: Neita Garnet M.D.   On: 08/04/2023 16:49    Pending Labs Unresulted Labs (From admission, onward)     Start     Ordered   08/05/23 0500  CBC  Tomorrow morning,   R        08/04/23 2208   08/05/23 0500  Basic metabolic panel  Tomorrow morning,   R        08/04/23 2208   08/04/23 2207  HIV Antibody (routine testing w rflx)  (HIV Antibody (Routine testing w reflex) panel)  Once,   R        08/04/23 2208   08/04/23 1554  Lipase, blood  Add-on,   AD        08/04/23 1553   08/04/23 1328  Blood culture (routine x 2)  BLOOD CULTURE X 2,   R (with STAT occurrences)     Question Answer Comment  Patient immune status Normal   Release to patient Immediate      08/04/23 1328            Vitals/Pain Today's Vitals   08/04/23 1930 08/04/23 2045 08/04/23 2115 08/04/23 2247  BP: (!) 137/108 (!) 158/85 (!) 150/98   Pulse: 93 62 (!) 131   Resp: 18     Temp:      TempSrc:      SpO2: 92% (!) 88% 94%   Weight:      Height:      PainSc: 9    10-Worst pain ever    Isolation Precautions No active isolations  Medications Medications  influenza vac split trivalent PF (FLULAVAL) injection 0.5 mL (has no administration in time range)  gabapentin (NEURONTIN) capsule 300 mg (300 mg Oral Given 08/04/23 2218)  busPIRone (BUSPAR) tablet 15 mg (15 mg Oral Given 08/04/23 2216)  baclofen (LIORESAL) tablet 10 mg (10 mg Oral Given 08/04/23 2215)  ezetimibe (ZETIA) tablet 10 mg (has no administration in time range)  tamsulosin (FLOMAX) capsule 0.4 mg (has no administration in time range)  metoprolol succinate  (TOPROL-XL) 24 hr tablet 100 mg (has no administration in time range)  pantoprazole (PROTONIX) EC tablet 80 mg (has no administration in time range)  acetaminophen (TYLENOL) tablet 650 mg (has no administration in time range)  oxyCODONE-acetaminophen (PERCOCET/ROXICET) 5-325 MG per tablet 1-2 tablet (has no administration in time range)  HYDROmorphone (DILAUDID) injection 1 mg (has no administration in time range)  ceFEPIme (MAXIPIME) 2 g in sodium chloride 0.9 % 100 mL IVPB (2 g Intravenous New Bag/Given 08/04/23 2236)  nicotine (NICODERM CQ - dosed in mg/24 hours) patch 14 mg (has no administration in time range)  lactated ringers infusion ( Intravenous New Bag/Given 08/04/23 2246)  ceFEPIme (MAXIPIME) 2 g in sodium chloride 0.9 % 100 mL IVPB (has no administration in time range)  vancomycin (VANCOREADY) IVPB 750 mg/150 mL (has no administration in time range)  fentaNYL (SUBLIMAZE) injection 50 mcg (50 mcg Intravenous Given 08/04/23 1453)  lactated ringers bolus 1,000 mL (0 mLs Intravenous Stopped 08/04/23 1647)  cefTRIAXone (ROCEPHIN) 2 g in sodium chloride 0.9 % 100 mL IVPB (0 g Intravenous Stopped 08/04/23 1615)  HYDROmorphone (DILAUDID) injection 1 mg (1 mg Intravenous Given 08/04/23 1525)  vancomycin (VANCOCIN) 2,500 mg in sodium chloride 0.9 % 500 mL IVPB (0 mg Intravenous Stopped 08/04/23 1836)  gabapentin (NEURONTIN) capsule 100 mg (100 mg Oral Given 08/04/23 1935)  fentaNYL (SUBLIMAZE) injection 25 mcg (25 mcg Intravenous Given 08/04/23 2213)    Mobility walks     Focused Assessments PT A&O x4. PT ambulatory. PT family at bedside.    R Recommendations: See Admitting Provider Note  Report given to:   Additional Notes: Call if further assistance if needed

## 2023-08-04 NOTE — ED Notes (Signed)
PT states pain medication did not help

## 2023-08-04 NOTE — ED Notes (Signed)
PT found to be pacing at the bedside stating he can not sit down due to the pain and discomfort in his legs. Pt and family member asking for pain medication.

## 2023-08-04 NOTE — Assessment & Plan Note (Signed)
-  continue baclofen, gabapentin

## 2023-08-04 NOTE — Assessment & Plan Note (Addendum)
-  BMP notable for elevated creatinine of 1.46 with a prior of 0.86. -continue on IV fluids -hold nephrotoxic agent. Holding home Lasix.

## 2023-08-04 NOTE — Assessment & Plan Note (Signed)
-  Stable at 9.9. Likely due to chronic blood loss- has chronic hematuria from recently diagnosed bladder mass

## 2023-08-05 ENCOUNTER — Inpatient Hospital Stay (HOSPITAL_COMMUNITY): Payer: Medicare HMO

## 2023-08-05 DIAGNOSIS — L0390 Cellulitis, unspecified: Secondary | ICD-10-CM | POA: Diagnosis not present

## 2023-08-05 LAB — CBC
HCT: 31.7 % — ABNORMAL LOW (ref 39.0–52.0)
Hemoglobin: 10.3 g/dL — ABNORMAL LOW (ref 13.0–17.0)
MCH: 30.7 pg (ref 26.0–34.0)
MCHC: 32.5 g/dL (ref 30.0–36.0)
MCV: 94.3 fL (ref 80.0–100.0)
Platelets: 228 10*3/uL (ref 150–400)
RBC: 3.36 MIL/uL — ABNORMAL LOW (ref 4.22–5.81)
RDW: 15.3 % (ref 11.5–15.5)
WBC: 9.6 10*3/uL (ref 4.0–10.5)
nRBC: 0 % (ref 0.0–0.2)

## 2023-08-05 LAB — BASIC METABOLIC PANEL
Anion gap: 14 (ref 5–15)
BUN: 12 mg/dL (ref 8–23)
CO2: 21 mmol/L — ABNORMAL LOW (ref 22–32)
Calcium: 9.8 mg/dL (ref 8.9–10.3)
Chloride: 104 mmol/L (ref 98–111)
Creatinine, Ser: 1.03 mg/dL (ref 0.61–1.24)
GFR, Estimated: >60 mL/min (ref >60)
Glucose, Bld: 88 mg/dL (ref 70–99)
Potassium: 4.1 mmol/L (ref 3.5–5.1)
Sodium: 139 mmol/L (ref 135–145)

## 2023-08-05 LAB — HIV ANTIBODY (ROUTINE TESTING W REFLEX): HIV Screen 4th Generation wRfx: NONREACTIVE

## 2023-08-05 MED ORDER — ALPRAZOLAM 0.5 MG PO TABS
0.5000 mg | ORAL_TABLET | Freq: Two times a day (BID) | ORAL | Status: DC | PRN
  Administered 2023-08-05 – 2023-08-12 (×4): 0.5 mg via ORAL
  Filled 2023-08-05 (×4): qty 1

## 2023-08-05 MED ORDER — VANCOMYCIN HCL IN DEXTROSE 1-5 GM/200ML-% IV SOLN
1000.0000 mg | Freq: Two times a day (BID) | INTRAVENOUS | Status: DC
  Administered 2023-08-05 – 2023-08-10 (×10): 1000 mg via INTRAVENOUS
  Filled 2023-08-05 (×10): qty 200

## 2023-08-05 MED ORDER — ENSURE ENLIVE PO LIQD
237.0000 mL | Freq: Two times a day (BID) | ORAL | Status: DC
  Administered 2023-08-05 – 2023-08-12 (×12): 237 mL via ORAL
  Filled 2023-08-05 (×2): qty 237

## 2023-08-05 NOTE — Progress Notes (Signed)
Transition of Care [facility]) - Inpatient Brief Assessment   Patient Details  Name: Vincent Cobb MRN: [id number] Date of Birth: 1961-08-04  Transition of Care [facility]) CM/SW Contact:    Durenda Guthrie, RN Phone Number: 08/05/2023, 2:50 PM   Clinical Narrative:    Transition of Care Asessment: Insurance and Status: Insurance coverage has been reviewed Patient has primary care physician: Yes Home environment has been reviewed: (P)  Phil Dopp - Fiance') Prior level of function:: independent Prior/Current Home Services: No current home services Social Determinants of Health Reivew: SDOH reviewed no interventions necessary Readmission risk has been reviewed: Yes Transition of care needs: no transition of care needs at this time

## 2023-08-05 NOTE — Consult Note (Signed)
WOC Nurse Consult Note: Reason for Consult:Bilateral breakdown to feet and lower legs.  Arterial disease has been ruled out.  Cellulitis to lower legs. Edema, pain.  Wound type: infectious/ venous Pressure Injury POA: NA Measurement: Scattered 0.5 cm breakdown to lower legs, dorsal feet and toes.  100% devitalized tissue to wound bed.  Wound bed: devitalized tissue Drainage (amount, consistency, odor) minimal serosanguinous  musty odor.   Periwound: Edema and erythema.  LEgs are too painful for light compression at this time but will consider later as antibiotic therapy should improve this.  Dressing procedure/placement/frequency: CLeanse bilateral lower legs with soap and water and pat dry. Apply VASHE moist gauze to wound beds (LAWSON # [phone]) (weave VASHE moist kerlix between toes as well) top with dry gauze and wrap lower legs with kerlix/tape.  Change each shift.   Will not follow at this time.  Please re-consult if needed.  Mike Gip MSN, RN, FNP-BC CWON Wound, Ostomy, Continence Nurse Outpatient [facility] [phone] Pager [phone]

## 2023-08-05 NOTE — Plan of Care (Signed)
Patient alert/oriented X4. Patient compliant with medication administration and tolerated IV abx. Patient remains sinus tachycardia on telemetry and wound care dressings were placed on B/L feet. Patient complains of burning in both his feet. Dilaudid administered as needed for pain.   Problem: Education: Goal: Knowledge of General Education information will improve Description: Including pain rating scale, medication(s)/side effects and non-pharmacologic comfort measures Outcome: Progressing   Problem: Health Behavior/Discharge Planning: Goal: Ability to manage health-related needs will improve Outcome: Progressing   Problem: Clinical Measurements: Goal: Ability to maintain clinical measurements within normal limits will improve Outcome: Progressing   Problem: Clinical Measurements: Goal: Will remain free from infection Outcome: Progressing   Problem: Clinical Measurements: Goal: Diagnostic test results will improve Outcome: Progressing   Problem: Clinical Measurements: Goal: Respiratory complications will improve Outcome: Progressing   Problem: Clinical Measurements: Goal: Will remain free from infection Outcome: Progressing   Problem: Clinical Measurements: Goal: Diagnostic test results will improve Outcome: Progressing   Problem: Clinical Measurements: Goal: Respiratory complications will improve Outcome: Progressing   Problem: Clinical Measurements: Goal: Cardiovascular complication will be avoided Outcome: Progressing   Problem: Activity: Goal: Risk for activity intolerance will decrease Outcome: Progressing   Problem: Nutrition: Goal: Adequate nutrition will be maintained Outcome: Progressing   Problem: Coping: Goal: Level of anxiety will decrease Outcome: Progressing   Problem: Elimination: Goal: Will not experience complications related to bowel motility Outcome: Progressing   Problem: Elimination: Goal: Will not experience complications related to  urinary retention Outcome: Progressing   Problem: Pain Managment: Goal: General experience of comfort will improve Outcome: Progressing   Problem: Safety: Goal: Ability to remain free from injury will improve Outcome: Progressing   Problem: Education: Goal: Understanding of post-operative needs will improve Outcome: Progressing   Problem: Education: Goal: Individualized Educational Video(s) Outcome: Progressing   Problem: Clinical Measurements: Goal: Postoperative complications will be avoided or minimized Outcome: Progressing   Problem: Respiratory: Goal: Will regain and/or maintain adequate ventilation Outcome: Progressing

## 2023-08-05 NOTE — Progress Notes (Signed)
Progress Note   Patient: Vincent Cobb ZOX:[id number] DOB: 1961-02-02 DOA: 08/04/2023     1 DOS: the patient was seen and examined on 08/05/2023     Subjective:  Seen and examined at bedside this morning Complained of pain and burning sensation involving the bilateral feet Was found pacing about in the room anxious Denies nausea vomiting chest pain or cough   Brief hospital course: From HPI "Vincent Cobb is a 62 y.o. male with medical history significant of recently diagnosed bladder mass, thyroid mass, HTN, chronic back pain, peripheral venous insufficiency, atrial flutter not on anticoagulation due to hematuria presents with worsening bilateral foot wound and pain concerning for cellulitis.     Assessment and Plan:   Wound cellulitis -scattered purulent ulcerations to bilateral dorsal foot, medial ankle and up to calf definitely concerning for osteomyelitis - Had workup with CTA of abdominal aorta with iliofemoral runoff with no significant arterial disease at [facility] on 07/20/23 MRI of the foot Continue current antibiotics; IV vancomycin and cefepime Continue as needed pain medication Care on board we appreciate input   Chronic back pain Continue baclofen, gabapentin   Tobacco use Counseled on cessation. Pt is willing to quit. Nicotine patch ordered   Atrial flutter (HCC) -appears to be in atrial tachycardia on EKG  -Not on anticoagulation due to chronic hematuria from bladder mass Continue metoprolol   Bladder mass Follows up with urology in [place]. Biopsy on hold since pt found to have atrial flutter with RVR during his cardiac pre-op evaluate. Cardiology is planning to do cardioversion once he is able to get back on anticoagulation.    Normocytic anemia -Stable at 9.9. Likely due to chronic blood loss- has chronic hematuria from recently diagnosed bladder mass   AKI (acute kidney injury) (HCC) -BMP notable for elevated creatinine of 1.46 with a  prior of 0.86. -continue on IV fluids -hold nephrotoxic agent. Holding home Lasix.        Advance Care Planning: Full   Consults: none   Family Communication: significant other at bedside     Physical examination:  Constitutional: Patient seen and examined appears anxious in no distress Eyes: lids and conjunctivae normal ENMT: Mucous membranes are moist.  Neck: normal, supple Respiratory: clear to auscultation bilaterally, no wheezing, no crackles. Normal respiratory effort. No accessory muscle use.  Cardiovascular: Regular rate and rhythm, no murmurs / rubs / gallops. No extremity edema. 2+ pedal pulses.  Abdomen: no tenderness, soft, no masses palpable  Musculoskeletal: no clubbing / cyanosis. No joint deformity upper and lower extremities.  Normal muscle tone.  Skin: scattered ulcerations of bilateral dorsal foot and medial malleolus. Worse to dorsal 2nd and 3rd digit on both foot with purulent material. There is surround necrosis to all ulcers and spreading erythema up lower extremity. Marland Kitchen  Ulcers also noted to right pre-tibial and mid-calf region.   Data Reviewed: I have reviewed patient's labs, including vitals as well as admitting providers documentation have also reviewed patient's x-ray of the lower extremity showing findings of cellulitis but no obvious findings of acute osteomyelitis, I have reviewed nursing documentation as well as ED physician's note  Family Communication: Cast with the wife present at bedside  Disposition: Status is: Inpatient   Planned Discharge Destination: Hopefully home with home health pending medical stabilization  Time spent: 55 minutes  Vitals:   08/04/23 2253 08/04/23 2253 08/05/23 0422 08/05/23 0809  BP: (!) 146/86  (!) 153/94   Pulse: (!) 110  (!) 128  Resp: 19  20   Temp:  97.8 F (36.6 C) 98.2 F (36.8 C) 97.9 F (36.6 C)  TempSrc:  Oral Oral   SpO2: 94%  99%   Weight:      Height:          Latest Ref Rng & Units  08/05/2023    7:12 AM 08/04/2023    2:01 PM 01/11/2023    7:15 PM  CBC  WBC 4.0 - 10.5 K/uL 9.6  10.3  6.9   Hemoglobin 13.0 - 17.0 g/dL 54.0  9.9  98.1   Hematocrit 39.0 - 52.0 % 31.7  30.4  31.6   Platelets 150 - 400 K/uL 228  243  282        Latest Ref Rng & Units 08/05/2023    7:12 AM 08/04/2023    2:01 PM 01/11/2023    7:15 PM  BMP  Glucose 70 - 99 mg/dL 88  191  97   BUN 8 - 23 mg/dL 12  17  7    Creatinine 0.61 - 1.24 mg/dL 4.78  2.95  6.21   Sodium 135 - 145 mmol/L 139  137  141   Potassium 3.5 - 5.1 mmol/L 4.1  4.1  3.7   Chloride 98 - 111 mmol/L 104  106  106   CO2 22 - 32 mmol/L 21  22  24    Calcium 8.9 - 10.3 mg/dL 9.8  9.4  8.6       Author: Loyce Dys, MD 08/05/2023 1:19 PM  For on call review [email or web address].

## 2023-08-05 NOTE — Progress Notes (Signed)
Pt sent to MRI for second attempt to scan bilateral feet. Pt in too much pain and reported he was given Ativan p.o. but nothing for pain. Pt was very nice and tried as hard as he could to hold still but pain was to great.

## 2023-08-05 NOTE — Progress Notes (Signed)
Pharmacy Antibiotic Note  Vincent Cobb is a 62 y.o. male PMH of recently diagnosed bladder mass, thyroid mass, HTN, chronic back pain, peripheral venous insufficiency, atrial flutter not on anticoagulation due to hematuria was admitted on 08/04/2023 with cellulitis.  Pharmacy has been consulted for cefepime and vancomycin dosing.  Scr improved overnight from 1.46>>1.03 with vancomycin scheduled 750 mg Q12H. Per nomogram dosing, patient is currently indicated for 1,000 mg IV Q12H, andd corroborated by AUC dosing. Patient has a Scr from march of this year at 0.9. Assuming patinet is approaching his baseline renal function.    Plan: Continue Cefepime 2g IV every 8 hours Increase vancomycn dose to 1,000 mg IV Q12H  Monitor renal function, F/U LOT, MRI   Height: 6\' 2"  (188 cm) Weight: 112 kg (247 lb) IBW/kg (Calculated) : 82.2  Temp (24hrs), Avg:97.9 F (36.6 C), Min:97.8 F (36.6 C), Max:98.2 F (36.8 C)  Recent Labs  Lab 08/04/23 1401 08/04/23 1411 08/04/23 1514 08/05/23 0712  WBC 10.3  --   --  9.6  CREATININE 1.46*  --   --  1.03  LATICACIDVEN  --  1.0 1.3  --     Estimated Creatinine Clearance: 100.2 mL/min (by C-G formula based on SCr of 1.03 mg/dL).    Allergies  Allergen Reactions   Celebrex [Celecoxib] Anaphylaxis   Simvastatin Other (See Comments)    Muscle pain   Vioxx [Rofecoxib]     Antimicrobials this admission: 10/16 vancomycin >> 10/16 cefepime >>   Microbiology results: 10/16 BCx: pending 10/16 Respiratory panel: negative  Thank you for allowing pharmacy to be a part of this patient's care.  Jani Gravel, PharmD Clinical Pharmacist  08/05/2023 8:33 AM

## 2023-08-05 NOTE — Progress Notes (Signed)
08/04/23 2350 Received pt to room 5N-11 from ED with Dx of Cellulitis.  Pt is A&ox4.  C/O bilateral leg pain.  Lower extremities are swollen, red and have many small ulcerated areas.  I encouraged pt to rest in bed with his legs propped up but he insists on sitting up.  Tele monitor applied and CCMD notified.  Oriented to room, call light and bed.  Family at bedside, call bell in reach. Kathryne Hitch

## 2023-08-05 NOTE — Progress Notes (Signed)
TOC

## 2023-08-06 DIAGNOSIS — L0390 Cellulitis, unspecified: Secondary | ICD-10-CM | POA: Diagnosis not present

## 2023-08-06 LAB — BASIC METABOLIC PANEL
Anion gap: 13 (ref 5–15)
BUN: 12 mg/dL (ref 8–23)
CO2: 21 mmol/L — ABNORMAL LOW (ref 22–32)
Calcium: 10.1 mg/dL (ref 8.9–10.3)
Chloride: 106 mmol/L (ref 98–111)
Creatinine, Ser: 1.1 mg/dL (ref 0.61–1.24)
GFR, Estimated: >60 mL/min (ref >60)
Glucose, Bld: 92 mg/dL (ref 70–99)
Potassium: 3.7 mmol/L (ref 3.5–5.1)
Sodium: 140 mmol/L (ref 135–145)

## 2023-08-06 LAB — BLOOD CULTURE ID PANEL (REFLEXED) - BCID2

## 2023-08-06 LAB — CBC
HCT: 28.5 % — ABNORMAL LOW (ref 39.0–52.0)
Hemoglobin: 9.3 g/dL — ABNORMAL LOW (ref 13.0–17.0)
MCH: 30.1 pg (ref 26.0–34.0)
MCHC: 32.6 g/dL (ref 30.0–36.0)
MCV: 92.2 fL (ref 80.0–100.0)
Platelets: 202 10*3/uL (ref 150–400)
RBC: 3.09 MIL/uL — ABNORMAL LOW (ref 4.22–5.81)
RDW: 15.1 % (ref 11.5–15.5)
WBC: 9.9 10*3/uL (ref 4.0–10.5)
nRBC: 0 % (ref 0.0–0.2)

## 2023-08-06 LAB — GLUCOSE, CAPILLARY: Glucose-Capillary: 86 mg/dL (ref 70–99)

## 2023-08-06 MED ORDER — METOPROLOL TARTRATE 5 MG/5ML IV SOLN
5.0000 mg | Freq: Four times a day (QID) | INTRAVENOUS | Status: DC | PRN
  Filled 2023-08-06: qty 5

## 2023-08-06 NOTE — Plan of Care (Signed)
CHL Tonsillectomy/Adenoidectomy, Postoperative PEDS care plan entered in error.

## 2023-08-06 NOTE — Progress Notes (Signed)
Progress Note   Patient: Vincent Cobb ZOX:[id number] DOB: July 08, 1961 DOA: 08/04/2023     2 DOS: the patient was seen and examined on 08/06/2023     Subjective:  Patient seen and examined at bedside this morning Patient impending sensation in the legs are improving Denies nausea vomiting chest pain abdominal pain or respiratory distress   Brief hospital course: From HPI "Vincent Cobb is a 62 y.o. male with medical history significant of recently diagnosed bladder mass, thyroid mass, HTN, chronic back pain, peripheral venous insufficiency, atrial flutter not on anticoagulation due to hematuria presents with worsening bilateral foot wound and pain concerning for cellulitis.      Assessment and Plan:     Cellulitis of the lower extremity right greater than left -scattered purulent ulcerations to bilateral dorsal foot, medial ankle and up to calf  MRI of the left and right lower extremity did not show any evidence of osteomyelitis Patient had had workup with CTA of abdominal aorta with iliofemoral runoff with no significant arterial disease at [facility] on 07/20/23 Continue current antibiotics; IV vancomycin and cefepime Continue as needed pain medication TOC on board we appreciate input Wound care consulted we appreciate input   Chronic back pain Continue baclofen, gabapentin   Tobacco use I have counseled on cessation. Pt is willing to quit. Nicotine patch ordered   Atrial flutter (HCC) -appears to be in atrial tachycardia on EKG  -Not on anticoagulation due to chronic hematuria from bladder mass Continue metoprolol   Bladder mass Follows up with urology in [place]. Biopsy on hold since pt found to have atrial flutter with RVR during his cardiac pre-op evaluate. Cardiology is planning to do cardioversion once he is able to get back on anticoagulation.    Normocytic anemia -Stable at 9.9. Likely due to chronic blood loss- has chronic hematuria from recently  diagnosed bladder mass   AKI (acute kidney injury) (HCC)-resolved -BMP notable for elevated creatinine of 1.46 with a prior of 0.86. Patient received IV fluid  holding home Lasix. Monitor renal function closely        Advance Care Planning: Full   Consults: none   Family Communication: significant other at bedside       Physical examination:   Constitutional: Patient seen and examined appears anxious in no distress Eyes: lids and conjunctivae normal ENMT: Mucous membranes are moist.  Neck: normal, supple Respiratory: clear to auscultation bilaterally, no wheezing, no crackles. Normal respiratory effort. No accessory muscle use.  Cardiovascular: Regular rate and rhythm, no murmurs / rubs / gallops. No extremity edema. 2+ pedal pulses.  Abdomen: no tenderness, soft, no masses palpable  Musculoskeletal: no clubbing / cyanosis. No joint deformity upper and lower extremities.  Normal muscle tone.  Skin: scattered ulcerations of bilateral dorsal foot and medial malleolus. Worse to dorsal 2nd and 3rd digit on both foot with purulent material. There is surround necrosis to all ulcers and spreading erythema up lower extremity. Marland Kitchen  Ulcers also noted to right pre-tibial and mid-calf region.    Data Reviewed: I have reviewed patient's MRI that did not show any acute osteomyelitis, I have reviewed below mentioned vitals as well as labs I have also reviewed nursing documentation as well as TOC manager's documentation   Family Communication: Cast with the wife present at bedside   Disposition: Status is: Inpatient    Planned Discharge Destination: Hopefully home with home health pending medical stabilization   Time spent: 45 minutes     Vitals:  08/06/23 0935 08/06/23 1015 08/06/23 1312 08/06/23 1403  BP: (!) 144/91 (!) 155/78 131/81 112/77  Pulse:      Resp: 17 15 12    Temp: 98.4 F (36.9 C) 99.3 F (37.4 C)    TempSrc:  Oral    SpO2: 95% 98%    Weight:      Height:           Latest Ref Rng & Units 08/06/2023    8:13 AM 08/05/2023    7:12 AM 08/04/2023    2:01 PM  BMP  Glucose 70 - 99 mg/dL 92  88  841   BUN 8 - 23 mg/dL 12  12  17    Creatinine 0.61 - 1.24 mg/dL 6.60  6.30  1.60   Sodium 135 - 145 mmol/L 140  139  137   Potassium 3.5 - 5.1 mmol/L 3.7  4.1  4.1   Chloride 98 - 111 mmol/L 106  104  106   CO2 22 - 32 mmol/L 21  21  22    Calcium 8.9 - 10.3 mg/dL 10.9  9.8  9.4        Latest Ref Rng & Units 08/06/2023    8:13 AM 08/05/2023    7:12 AM 08/04/2023    2:01 PM  CBC  WBC 4.0 - 10.5 K/uL 9.9  9.6  10.3   Hemoglobin 13.0 - 17.0 g/dL 9.3  32.3  9.9   Hematocrit 39.0 - 52.0 % 28.5  31.7  30.4   Platelets 150 - 400 K/uL 202  228  243       Author: Loyce Dys, MD 08/06/2023 4:12 PM  For on call review [email or web address].

## 2023-08-06 NOTE — Progress Notes (Signed)
MEWS Progress Note  Patient Details Name: Vincent Cobb MRN: [id number] DOB: [birth year] Today's Date: 08/06/2023   MEWS Flowsheet Documentation:  Assess: MEWS Score Temp: 98.4 F (36.9 C) BP: (!) 144/91 MAP (mmHg): 107 Pulse Rate: (!) 103 ECG Heart Rate: (!) 131 Resp: 17 Level of Consciousness: Responds to Voice SpO2: 95 % O2 Device: Room Air Patient Activity (if Appropriate): In bed Assess: MEWS Score MEWS Temp: 0 MEWS Systolic: 0 MEWS Pulse: 3 MEWS RR: 0 MEWS LOC: 1 MEWS Score: 4 MEWS Score Color: Red Assess: SIRS CRITERIA SIRS Temperature : 0 SIRS Respirations : 0 SIRS Pulse: 1 SIRS WBC: 0 SIRS Score Sum : 1 SIRS Temperature : 0 SIRS Pulse: 1 SIRS Respirations : 0 SIRS WBC: 0 SIRS Score Sum : 1 Assess: if the MEWS score is Yellow or Red Were vital signs accurate and taken at a resting state?: Yes Does the patient meet 2 or more of the SIRS criteria?: No MEWS guidelines implemented : Yes, red Treat MEWS Interventions: Considered administering scheduled or prn medications/treatments as ordered Take Vital Signs Increase Vital Sign Frequency : Red: Q1hr x2, continue Q4hrs until patient remains green for 12hrs Escalate MEWS: Escalate: Red: Discuss with charge nurse and notify provider. Consider notifying RRT. If remains red for 2 hours consider need for higher level of care Notify: Charge Nurse/RN Name of Charge Nurse/RN Notified: [employer] fast streamer Name/Title: Dr. Meriam Sprague Attending Date Provider Notified: 08/06/23 Time Provider Notified: [phone] Method of Notification:  (Secure Chat) Notification Reason: Change in status (Red MEWS) Provider response: Evaluate remotely      Kallie Locks 08/06/2023, 9:41 AM

## 2023-08-06 NOTE — Plan of Care (Signed)
Dressing changes on bilateral legs completed.  Medicated for pain, see MAR.  Problem: Education: Goal: Knowledge of General Education information will improve Description: Including pain rating scale, medication(s)/side effects and non-pharmacologic comfort measures Outcome: Progressing   Problem: Health Behavior/Discharge Planning: Goal: Ability to manage health-related needs will improve Outcome: Progressing   Problem: Clinical Measurements: Goal: Ability to maintain clinical measurements within normal limits will improve Outcome: Progressing Goal: Will remain free from infection Outcome: Progressing Goal: Diagnostic test results will improve Outcome: Progressing

## 2023-08-06 NOTE — Progress Notes (Signed)
PHARMACY - PHYSICIAN COMMUNICATION CRITICAL VALUE ALERT - BLOOD CULTURE IDENTIFICATION (BCID)  Vincent Cobb is an 62 y.o. male who presented to [facility] on 08/04/2023 with a chief complaint of significant burning pain to both legs. Per physician progress notes, concern for wound cellulitis / osteomyelitis.   Assessment: Patient has 1/4 blood cultures (Gm stain) positive for GPC in clusters.   Name of physician (or Provider) Contacted: Anthoney Harada, NP   Current antibiotics: Patient is being treated with vancomycin and cefepime.   Changes to prescribed antibiotics recommended:  Patient is on recommended antibiotics - No changes needed  No results found for this or any previous visit.  [name] 08/06/2023  8:38 PM

## 2023-08-06 NOTE — Care Management Important Message (Signed)
Important Message  Patient Details  Name: AIREN MOTTE MRN: [id number] Date of Birth: Nov 04, 1960   Important Message Given:  Yes - Medicare IM     Sherilyn Banker 08/06/2023, 1:58 PM

## 2023-08-06 NOTE — Plan of Care (Signed)
  Problem: Education: Goal: Knowledge of General Education information will improve Description: Including pain rating scale, medication(s)/side effects and non-pharmacologic comfort measures Outcome: Progressing   Problem: Activity: Goal: Risk for activity intolerance will decrease Outcome: Progressing   

## 2023-08-07 DIAGNOSIS — L0390 Cellulitis, unspecified: Secondary | ICD-10-CM | POA: Diagnosis not present

## 2023-08-07 LAB — BASIC METABOLIC PANEL
Anion gap: 12 (ref 5–15)
BUN: 14 mg/dL (ref 8–23)
CO2: 20 mmol/L — ABNORMAL LOW (ref 22–32)
Calcium: 9 mg/dL (ref 8.9–10.3)
Chloride: 106 mmol/L (ref 98–111)
Creatinine, Ser: 1.28 mg/dL — ABNORMAL HIGH (ref 0.61–1.24)
GFR, Estimated: >60 mL/min (ref >60)
Glucose, Bld: 111 mg/dL — ABNORMAL HIGH (ref 70–99)
Potassium: 3.4 mmol/L — ABNORMAL LOW (ref 3.5–5.1)
Sodium: 138 mmol/L (ref 135–145)

## 2023-08-07 LAB — CBC
HCT: 27.1 % — ABNORMAL LOW (ref 39.0–52.0)
Hemoglobin: 8.9 g/dL — ABNORMAL LOW (ref 13.0–17.0)
MCH: 30.6 pg (ref 26.0–34.0)
MCHC: 32.8 g/dL (ref 30.0–36.0)
MCV: 93.1 fL (ref 80.0–100.0)
Platelets: 199 10*3/uL (ref 150–400)
RBC: 2.91 MIL/uL — ABNORMAL LOW (ref 4.22–5.81)
RDW: 15 % (ref 11.5–15.5)
WBC: 9 10*3/uL (ref 4.0–10.5)
nRBC: 0 % (ref 0.0–0.2)

## 2023-08-07 MED ORDER — POTASSIUM CHLORIDE CRYS ER 20 MEQ PO TBCR
40.0000 meq | EXTENDED_RELEASE_TABLET | Freq: Once | ORAL | Status: AC
  Administered 2023-08-07: 40 meq via ORAL
  Filled 2023-08-07: qty 2

## 2023-08-07 NOTE — Progress Notes (Signed)
Progress Note   Patient: Vincent Cobb GNF:[id number] DOB: Dec 12, 1960 DOA: 08/04/2023     3 DOS: the patient was seen and examined on 08/07/2023     Subjective:  -Patient seen alongside patient's sister and brother-in-law. -No new complaints. -Poor historian.  Brief hospital course: Patient is a 62 year old Caucasian male, obese, with past medical history significant for recently diagnosed bladder mass, thyroid mass, HTN, chronic back pain, peripheral venous insufficiency, and atrial flutter not on anticoagulation due to hematuria.  Patient was admitted with worsening bilateral foot wound and pain concerning for cellulitis.  Patient is currently on IV vancomycin and cefepime.  Bilateral lower extremities are wrapped.  Blood cultures have not grown any organisms.  Wound culture has not been visualized.    Assessment and Plan: Cellulitis of the lower extremity right greater than left -scattered purulent ulcerations to bilateral dorsal foot, medial ankle and up to calf  MRI of the left and right lower extremity did not show any evidence of osteomyelitis Patient had had workup with CTA of abdominal aorta with iliofemoral runoff with no significant arterial disease at [facility] on 07/20/23 -Continue current antibiotics; IV vancomycin and cefepime -Continue as needed pain medication -TOC on board we appreciate input -Wound care consulted we appreciate input   Chronic back pain Continue baclofen, gabapentin   Tobacco use I have counseled on cessation. Pt is willing to quit. Nicotine patch ordered   Atrial flutter (HCC) -appears to be in atrial tachycardia on EKG  -Not on anticoagulation due to chronic hematuria from bladder mass Continue metoprolol   Bladder mass Follows up with urology in [place]. Biopsy on hold since pt found to have atrial flutter with RVR during his cardiac pre-op evaluate. Cardiology is planning to do cardioversion once he is able to get back on  anticoagulation.    Normocytic anemia -Stable at 9.9. Likely due to chronic blood loss- has chronic hematuria from recently diagnosed bladder mass   AKI (acute kidney injury) (HCC)-resolved -BMP notable for elevated creatinine of 1.46 with a prior of 0.86. Patient received IV fluid  holding home Lasix. Monitor renal function closely        Advance Care Planning: Full   Consults: none   Family Communication: significant other at bedside       Physical examination: General condition: Patient is obese.  Not in any distress.  Both lower extremities are wrapped.  HEENT: Patient is pale.  No jaundice. Neck: Supple. Lungs: Clear to auscultation. CVS: S1-S2. Abdomen: Obese, soft and nontender. Neuro: Awake and alert.  Moves all extremities. Extremities: Both lower legs are wrapped.     Family Communication: Cast with the wife present at bedside   Disposition: Status is: Inpatient    Planned Discharge Destination: Hopefully home with home health pending medical stabilization   Time spent: 35 minutes     Vitals:   08/06/23 1704 08/06/23 2026 08/07/23 0726 08/07/23 1155  BP: 122/77 117/80 (!) 170/95 (!) 156/98  Pulse: (!) 106  (!) 130 (!) 102  Resp: 18 18 18 17   Temp: 99.1 F (37.3 C) 99.1 F (37.3 C) 98.4 F (36.9 C) 100 F (37.8 C)  TempSrc: Oral Oral Oral Oral  SpO2: 100% 95% 98% 99%  Weight:      Height:          Latest Ref Rng & Units 08/07/2023    4:35 AM 08/06/2023    8:13 AM 08/05/2023    7:12 AM  BMP  Glucose 70 -  99 mg/dL 161  92  88   BUN 8 - 23 mg/dL 14  12  12    Creatinine 0.61 - 1.24 mg/dL 0.96  0.45  4.09   Sodium 135 - 145 mmol/L 138  140  139   Potassium 3.5 - 5.1 mmol/L 3.4  3.7  4.1   Chloride 98 - 111 mmol/L 106  106  104   CO2 22 - 32 mmol/L 20  21  21    Calcium 8.9 - 10.3 mg/dL 9.0  81.1  9.8        Latest Ref Rng & Units 08/07/2023    4:35 AM 08/06/2023    8:13 AM 08/05/2023    7:12 AM  CBC  WBC 4.0 - 10.5 K/uL 9.0  9.9  9.6    Hemoglobin 13.0 - 17.0 g/dL 8.9  9.3  91.4   Hematocrit 39.0 - 52.0 % 27.1  28.5  31.7   Platelets 150 - 400 K/uL 199  202  228       Author: Barnetta Chapel, MD 08/07/2023 3:38 PM  For on call review [email or web address].

## 2023-08-08 DIAGNOSIS — L0390 Cellulitis, unspecified: Secondary | ICD-10-CM | POA: Diagnosis not present

## 2023-08-08 LAB — CBC
HCT: 28.8 % — ABNORMAL LOW (ref 39.0–52.0)
Hemoglobin: 9.4 g/dL — ABNORMAL LOW (ref 13.0–17.0)
MCH: 30.3 pg (ref 26.0–34.0)
MCHC: 32.6 g/dL (ref 30.0–36.0)
MCV: 92.9 fL (ref 80.0–100.0)
Platelets: 215 10*3/uL (ref 150–400)
RBC: 3.1 MIL/uL — ABNORMAL LOW (ref 4.22–5.81)
RDW: 14.8 % (ref 11.5–15.5)
WBC: 10.3 10*3/uL (ref 4.0–10.5)
nRBC: 0 % (ref 0.0–0.2)

## 2023-08-08 LAB — RENAL FUNCTION PANEL
Albumin: 2.8 g/dL — ABNORMAL LOW (ref 3.5–5.0)
Anion gap: 10 (ref 5–15)
BUN: 14 mg/dL (ref 8–23)
CO2: 21 mmol/L — ABNORMAL LOW (ref 22–32)
Calcium: 9.5 mg/dL (ref 8.9–10.3)
Chloride: 107 mmol/L (ref 98–111)
Creatinine, Ser: 1.12 mg/dL (ref 0.61–1.24)
GFR, Estimated: >60 mL/min (ref >60)
Glucose, Bld: 103 mg/dL — ABNORMAL HIGH (ref 70–99)
Phosphorus: 3.3 mg/dL (ref 2.5–4.6)
Potassium: 4.1 mmol/L (ref 3.5–5.1)
Sodium: 138 mmol/L (ref 135–145)

## 2023-08-08 LAB — CULTURE, BLOOD (ROUTINE X 2): Special Requests: ADEQUATE

## 2023-08-08 LAB — MAGNESIUM: Magnesium: 1.7 mg/dL (ref 1.7–2.4)

## 2023-08-08 NOTE — Progress Notes (Signed)
Pharmacy Antibiotic Note  Vincent Cobb is a 62 y.o. male PMH of recently diagnosed bladder mass, thyroid mass, HTN, chronic back pain, peripheral venous insufficiency, atrial flutter not on anticoagulation due to hematuria was admitted on 08/04/2023 with cellulitis.  Pharmacy has been consulted for cefepime and vancomycin dosing.  Scr did worsen slightly to 1.28, however has since improved to 1.12 while on 1000 mg IV Q12h. Based on current renal function, eAUC 501. Patient has a Scr from march of this year at 0.9. Continue to monitor renal function and adjust antibiotics as needed.    Plan: Continue Cefepime 2g IV every 8 hours Continue vancomycn 1,000 mg IV Q12H  Monitor renal function, F/U LOT, MRI   Height: 6\' 2"  (188 cm) Weight: 112 kg (247 lb) IBW/kg (Calculated) : 82.2  Temp (24hrs), Avg:98.9 F (37.2 C), Min:98.3 F (36.8 C), Max:99.9 F (37.7 C)  Recent Labs  Lab 08/04/23 1401 08/04/23 1411 08/04/23 1514 08/05/23 0712 08/06/23 0813 08/07/23 0435 08/08/23 0935  WBC 10.3  --   --  9.6 9.9 9.0 10.3  CREATININE 1.46*  --   --  1.03 1.10 1.28* 1.12  LATICACIDVEN  --  1.0 1.3  --   --   --   --     Estimated Creatinine Clearance: 92.2 mL/min (by C-G formula based on SCr of 1.12 mg/dL).    Allergies  Allergen Reactions   Celebrex [Celecoxib] Anaphylaxis   Simvastatin Other (See Comments)    Muscle pain   Vioxx [Rofecoxib]     Antimicrobials this admission: 10/16 vancomycin >> 10/16 cefepime >>   Microbiology results: 10/16 BCx: micrococcus spp 1/4  10/16 Respiratory panel: negative  Thank you for allowing pharmacy to be a part of this patient's care.  Lennie Muckle, PharmD PGY1 Pharmacy Resident 08/08/2023 12:43 PM

## 2023-08-08 NOTE — Progress Notes (Signed)
Progress Note   Patient: Vincent Cobb:[id number] DOB: 09-14-1961 DOA: 08/04/2023     4 DOS: the patient was seen and examined on 08/08/2023     Subjective:  -Patient seen alongside patient's girlfriend. -No new complaints. -Poor historian.  Brief hospital course: Patient is a 62 year old Caucasian male, obese, with past medical history significant for recently diagnosed bladder mass, thyroid mass, HTN, chronic back pain, peripheral venous insufficiency, and atrial flutter not on anticoagulation due to hematuria.  Patient was admitted with worsening bilateral foot wound and pain concerning for cellulitis.  Patient is currently on IV vancomycin and cefepime.  Bilateral lower extremities are wrapped.  Blood cultures have not grown any organisms.  Wound culture has not been visualized.    Assessment and Plan: Cellulitis of the lower extremity right greater than left -scattered purulent ulcerations to bilateral dorsal foot, medial ankle and up to calf  MRI of the left and right lower extremity did not show any evidence of osteomyelitis Patient had had workup with CTA of abdominal aorta with iliofemoral runoff with no significant arterial disease at [facility] on 07/20/23 -Continue current antibiotics; IV vancomycin and cefepime -Continue as needed pain medication -TOC on board we appreciate input -Wound care consulted, appreciate input   Chronic back pain Continue baclofen, gabapentin   Tobacco use I have counseled on cessation. Pt is willing to quit. Nicotine patch ordered   Atrial flutter (HCC) -appears to be in atrial tachycardia on EKG  -Not on anticoagulation due to chronic hematuria from bladder mass Continue metoprolol   Bladder mass Follows up with urology in [place]. Biopsy on hold since pt found to have atrial flutter with RVR during his cardiac pre-op evaluate. Cardiology is planning to do cardioversion once he is able to get back on anticoagulation.     Normocytic anemia -Stable at 9.9. Likely due to chronic blood loss- has chronic hematuria from recently diagnosed bladder mass   AKI (acute kidney injury) (HCC)-resolved -BMP notable for elevated creatinine of 1.46 with a prior of 0.86. Patient received IV fluid  holding home Lasix. Monitor renal function closely        Advance Care Planning: Full   Consults: none   Family Communication: significant other at bedside       Physical examination: General condition: Patient is obese.  Not in any distress.  Both lower extremities are wrapped.  HEENT: Patient is pale.  No jaundice. Neck: Supple. Lungs: Clear to auscultation. CVS: S1-S2. Abdomen: Obese, soft and nontender. Neuro: Awake and alert.  Moves all extremities. Extremities: Both lower legs are wrapped.     Family Communication: Cast with the wife present at bedside   Disposition: Status is: Inpatient    Planned Discharge Destination: Hopefully home with home health pending medical stabilization   Time spent: 35 minutes     Vitals:   08/07/23 2056 08/08/23 0512 08/08/23 0735 08/08/23 1314  BP: 131/81 (!) 142/92 (!) 163/97 (!) 145/93  Pulse: 89 90 93 89  Resp: 18 18  18   Temp: 98.3 F (36.8 C) 99.9 F (37.7 C) 98.3 F (36.8 C) 98.2 F (36.8 C)  TempSrc: Oral Oral Oral Oral  SpO2: 100% 100% 97% 99%  Weight:      Height:          Latest Ref Rng & Units 08/08/2023    9:35 AM 08/07/2023    4:35 AM 08/06/2023    8:13 AM  BMP  Glucose 70 - 99 mg/dL 440  102  92   BUN 8 - 23 mg/dL 14  14  12    Creatinine 0.61 - 1.24 mg/dL 2.13  0.86  5.78   Sodium 135 - 145 mmol/L 138  138  140   Potassium 3.5 - 5.1 mmol/L 4.1  3.4  3.7   Chloride 98 - 111 mmol/L 107  106  106   CO2 22 - 32 mmol/L 21  20  21    Calcium 8.9 - 10.3 mg/dL 9.5  9.0  46.9        Latest Ref Rng & Units 08/08/2023    9:35 AM 08/07/2023    4:35 AM 08/06/2023    8:13 AM  CBC  WBC 4.0 - 10.5 K/uL 10.3  9.0  9.9   Hemoglobin 13.0 - 17.0 g/dL  9.4  8.9  9.3   Hematocrit 39.0 - 52.0 % 28.8  27.1  28.5   Platelets 150 - 400 K/uL 215  199  202       Author: Barnetta Chapel, MD 08/08/2023 4:14 PM  For on call review [email or web address].

## 2023-08-08 NOTE — Plan of Care (Signed)

## 2023-08-09 DIAGNOSIS — L0390 Cellulitis, unspecified: Secondary | ICD-10-CM | POA: Diagnosis not present

## 2023-08-09 LAB — CULTURE, BLOOD (ROUTINE X 2): Culture: NO GROWTH

## 2023-08-09 LAB — HEPATITIS PANEL, ACUTE
HCV Ab: NONREACTIVE
Hep A IgM: NONREACTIVE
Hep B C IgM: NONREACTIVE
Hepatitis B Surface Ag: NONREACTIVE

## 2023-08-09 MED ORDER — METRONIDAZOLE 500 MG/100ML IV SOLN
500.0000 mg | Freq: Two times a day (BID) | INTRAVENOUS | Status: DC
  Administered 2023-08-09: 500 mg via INTRAVENOUS
  Filled 2023-08-09: qty 100

## 2023-08-09 MED ORDER — CYCLOBENZAPRINE HCL 10 MG PO TABS
10.0000 mg | ORAL_TABLET | Freq: Three times a day (TID) | ORAL | Status: DC | PRN
  Administered 2023-08-09 – 2023-08-10 (×3): 10 mg via ORAL
  Filled 2023-08-09 (×3): qty 1

## 2023-08-09 MED ORDER — SODIUM CHLORIDE 0.9 % IV SOLN
2.0000 g | INTRAVENOUS | Status: DC
  Administered 2023-08-09: 2 g via INTRAVENOUS
  Filled 2023-08-09: qty 20

## 2023-08-09 NOTE — Plan of Care (Signed)
  Problem: Education: Goal: Knowledge of General Education information will improve Description Including pain rating scale, medication(s)/side effects and non-pharmacologic comfort measures Outcome: Progressing   

## 2023-08-09 NOTE — Progress Notes (Addendum)
Progress Note   Patient: Vincent Cobb QIH:[id number] DOB: [birth year] DOA: 08/04/2023     5 DOS: the patient was seen and examined on 08/09/2023     Subjective:  Reports ongoing pain in distal lower extremities  Brief hospital course: Patient is a 62 year old Caucasian male, obese, with past medical history significant for recently diagnosed bladder mass, thyroid mass, HTN, chronic back pain, peripheral venous insufficiency, and atrial flutter not on anticoagulation due to hematuria.  Patient was admitted with worsening bilateral foot wound and pain concerning for cellulitis.  Patient is currently on IV vancomycin and cefepime.  Bilateral lower extremities are wrapped.  He also reports new lesions for past week on his fingers   Assessment and Plan: Ulcerated lesions bilateral lower extremities and hands Mri neg for osteo. [facility]. Has been on broad spectrum abx here. Lesions are not improving. I am concerned for an infection we are not adequately treating infection or that another process is at play, most specifically a vasculitis. 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 Karin Lieu can't see the actual images so has ordered ABIs - will ask ID to consult - will start vasculitis w/u with ana, anca, anti-dsdna, anti-smith, c3/c4/c50, cryoglobulins, hepatitis panel, rf. Hiv already negative - secure chat w/ dr. Sheliah Hatch of gen surg today requesting biopsy of one of the lesions, he says someone from his team will be able to accomplish this tomorrow - repeat blood cultures, add flagyl to current vanc/cefepime  Micrococcus bacteremia On one blood culture, likely contaminant, have asked ID to weigh in   Chronic back pain Continue baclofen, gabapentin   Tobacco use Previous counseling. Patch ordered  Atrial flutter (HCC) -appears to be in atrial tachycardia on EKG  -Not on anticoagulation due to chronic hematuria from bladder  mass Continue metoprolol   Bladder malignancy Follows up with urology in [place]. Biopsy on hold since pt found to have atrial flutter with RVR during his cardiac pre-op evaluate. Cardiology is planning to do cardioversion once he is able to get back on anticoagulation.    Normocytic anemia -Stable at 9.9. Likely due to chronic blood loss- has chronic hematuria from recently diagnosed bladder mass   AKI (acute kidney injury) (HCC)-resolved -BMP notable for elevated creatinine of 1.46 with a prior of 0.86. Patient received IV fluid  holding home Lasix. Monitor renal function closely Cr improved today to 1.12        Advance Care Planning: Full   Consults: none   Family Communication: significant other at bedside       Physical examination: General condition: Patient is obese.  Not in any distress.  Both lower extremities are wrapped.  HEENT: Patient is pale.  No jaundice. Neck: Supple. Lungs: Clear to auscultation. CVS: S1-S2. Abdomen: Obese, soft and nontender. Neuro: Awake and alert.  Moves all extremities. Extremities: ulcerations b/l lower extremities from calf down, also few scattered lesions on hands   Family Communication: Cast with the wife present at bedside   Disposition: Status is: Inpatient    Planned Discharge Destination: Hopefully home with home health pending medical stabilization   Time spent: 35 minutes     Vitals:   08/08/23 1314 08/08/23 2100 08/09/23 0407 08/09/23 0807  BP: (!) 145/93 121/81 (!) 142/94 (!) 156/86  Pulse: 89 98 89 87  Resp: 18 17  17   Temp: 98.2 F (36.8 C) 98.6 F (37 C) 98.1 F (36.7 C) 98  F (36.7 C)  TempSrc: Oral Oral Oral Oral  SpO2: 99% 96% 100% 99%  Weight:      Height:          Latest Ref Rng & Units 08/08/2023    9:35 AM 08/07/2023    4:35 AM 08/06/2023    8:13 AM  BMP  Glucose 70 - 99 mg/dL 595  638  92   BUN 8 - 23 mg/dL 14  14  12    Creatinine 0.61 - 1.24 mg/dL 7.56  4.33  2.95   Sodium 135 - 145  mmol/L 138  138  140   Potassium 3.5 - 5.1 mmol/L 4.1  3.4  3.7   Chloride 98 - 111 mmol/L 107  106  106   CO2 22 - 32 mmol/L 21  20  21    Calcium 8.9 - 10.3 mg/dL 9.5  9.0  18.8        Latest Ref Rng & Units 08/08/2023    9:35 AM 08/07/2023    4:35 AM 08/06/2023    8:13 AM  CBC  WBC 4.0 - 10.5 K/uL 10.3  9.0  9.9   Hemoglobin 13.0 - 17.0 g/dL 9.4  8.9  9.3   Hematocrit 39.0 - 52.0 % 28.8  27.1  28.5   Platelets 150 - 400 K/uL 215  199  202       Author: Silvano Bilis, MD 08/09/2023 12:50 PM  For on call review [email or web address].

## 2023-08-09 NOTE — Plan of Care (Signed)
  Problem: Education: Goal: Knowledge of General Education information will improve Description: Including pain rating scale, medication(s)/side effects and non-pharmacologic comfort measures Outcome: Progressing   Problem: Clinical Measurements: Goal: Will remain free from infection Outcome: Progressing   Problem: Activity: Goal: Risk for activity intolerance will decrease Outcome: Progressing   Problem: Coping: Goal: Level of anxiety will decrease Outcome: Progressing   Problem: Pain Managment: Goal: General experience of comfort will improve Outcome: Progressing   

## 2023-08-09 NOTE — Consult Note (Signed)
[facility]    Date of Admission:  08/04/2023   Total days of inpatient antibiotics 4        Reason for Consult: Cellulitis    Principal Problem:   Wound cellulitis Active Problems:   AKI (acute kidney injury) (HCC)   Normocytic anemia   Bladder mass   Atrial flutter (HCC)   Tobacco use   Chronic back pain   Assessment: 62 year old male with recently diagnosed bladder mass, thyroid mass and followed by wound care for bilateral lower extremity swelling starting this year then seen in the ED on 10/1 where he was prescribed clindamycin and Bactrim for lower extremity wounds admitted with: #Lower lower extremity wounds suspicious for cellulitis #Opiate dependence, 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  Right foot noted subchondral cyst - Patient notes that his UDS is positive due to taking his leftover methadone.  He notes that he was followed at methadone clinic for years for opioid dependence(pills) following back surgery/back pain. -She has been on antibiotics since admission with Vanco, cefepime, metronidazole without improvement of wounds.  His also had some low-grade fevers hospitalization. Recommendations:  -Continue vancomycin,  - DC cefepime and metronidazole - start ceftriaxone - Follow hepatitis panel, autoimmune workup - His clinical picture including MRI, no leukocytosis, minimal edema around ulcerations not appear consistent with cellulitis/infection.  Clinical picture is more consistent with some sort of inflammatory change vs malignancy?.  Ideally, would like a biopsy for further characterization.  #Bladder mass - Follows with urology in [place]  #Blood cultures 1/4 micrococcus - Consistent with contamination Microbiology:   Antibiotics: Vancomycin, cefepime 10/16-present Metronidazole 10/21  Cultures: Blood 10/61/4 micrococcus 10/21  pending   HPI: Vincent Cobb is a 62 y.o. male with medical history significant for recently diagnosed bladder mass, thyroid mass, hypertension, chronic back pain.  States seen at methadone clinic, A-flutter not LNK coagulation, peripheral vascular insufficiency presented with bilateral foot wound and pain.  He was seen at [facility] for bilateral lower extremity pain and chronic wounds CTA of abdominal aorta showed iliofemoral runoff no severe arterial disease.  Discharged on clindamycin and Bactrim.  Mrak showed bilateral foot ulcers.  Started on vancomycin and cefepime.  ID engaged as wound did not improve on antibiotics.   Review of Systems: Review of Systems  All other systems reviewed and are negative.   Past Medical History:  Diagnosis Date   Cancer (HCC)    thyroid and bladder   Hypertension    Neuropathy    Tachycardia     Social History   Tobacco Use   Smoking status: Every Day    Types: Cigarettes   Smokeless tobacco: Never  Substance Use Topics   Alcohol use: Yes    Comment: occ use   Drug use: Never    History reviewed. No pertinent family history. Scheduled Meds:  baclofen  10 mg Oral TID   busPIRone  15 mg Oral BID   ezetimibe  10 mg Oral Daily   feeding supplement  237 mL Oral BID BM   gabapentin  300 mg Oral TID   metoprolol succinate  100 mg Oral Daily   nicotine  14 mg Transdermal Daily   pantoprazole  80 mg Oral Daily   tamsulosin  0.4 mg Oral QPC supper   Continuous Infusions:  ceFEPime (MAXIPIME) IV 2 g (08/09/23 0848)   metronidazole 500 mg (  08/09/23 1411)   vancomycin 1,000 mg (08/09/23 0415)   PRN Meds:.acetaminophen, ALPRAZolam, cyclobenzaprine, HYDROmorphone (DILAUDID) injection, metoprolol tartrate, oxyCODONE-acetaminophen Allergies  Allergen Reactions   Celebrex [Celecoxib] Anaphylaxis   Simvastatin Other (See Comments)    Muscle pain   Vioxx [Rofecoxib]     OBJECTIVE: Blood pressure (!) 145/90, pulse 87, temperature (!)  97.5 F (36.4 C), temperature source Oral, resp. rate 17, height 6\' 2"  (1.88 m), weight 112 kg, SpO2 99%.  Physical Exam Constitutional:      General: He is not in acute distress.    Appearance: He is normal weight. He is not toxic-appearing.  HENT:     Head: Normocephalic and atraumatic.     Right Ear: External ear normal.     Left Ear: External ear normal.     Nose: No congestion or rhinorrhea.     Mouth/Throat:     Mouth: Mucous membranes are moist.     Pharynx: Oropharynx is clear.  Eyes:     Extraocular Movements: Extraocular movements intact.     Conjunctiva/sclera: Conjunctivae normal.     Pupils: Pupils are equal, round, and reactive to light.  Cardiovascular:     Rate and Rhythm: Normal rate and regular rhythm.     Heart sounds: No murmur heard.    No friction rub. No gallop.  Pulmonary:     Effort: Pulmonary effort is normal.     Breath sounds: Normal breath sounds.  Abdominal:     General: Abdomen is flat. Bowel sounds are normal.     Palpations: Abdomen is soft.  Musculoskeletal:        General: No swelling.     Cervical back: Normal range of motion and neck supple.  Neurological:     General: No focal deficit present.     Mental Status: He is oriented to person, place, and time.  Psychiatric:        Mood and Affect: Mood normal.        Lab Results Lab Results  Component Value Date   WBC 10.3 08/08/2023   HGB 9.4 (L) 08/08/2023   HCT 28.8 (L) 08/08/2023   MCV 92.9 08/08/2023   PLT 215 08/08/2023    Lab Results  Component Value Date   CREATININE 1.12 08/08/2023   BUN 14 08/08/2023   NA 138 08/08/2023   K 4.1 08/08/2023   CL 107 08/08/2023   CO2 21 (L) 08/08/2023    Lab Results  Component Value Date   ALT 12 08/04/2023   AST 19 08/04/2023   ALKPHOS 105 08/04/2023   BILITOT 0.6 08/04/2023       Danelle Earthly, MD [facility] 08/09/2023, 2:41 PM I have personally spent 82 minutes  involved in face-to-face and non-face-to-face activities for this patient on the day of the visit. Professional time spent includes the following activities: Preparing to see the patient (review of tests), Obtaining and/or reviewing separately obtained history (admission/discharge record), Performing a medically appropriate examination and/or evaluation , Ordering medications/tests/procedures, referring and communicating with other health care professionals, Documenting clinical information in the EMR, Independently interpreting results (not separately reported), Communicating results to the patient/family/caregiver, Counseling and educating the patient/family/caregiver and Care coordination (not separately reported).

## 2023-08-09 NOTE — Progress Notes (Signed)
Mobility Specialist: Progress Note   08/09/23 1651  Mobility  Activity Dangled on edge of bed (+ EOB exercise)  Level of Assistance Independent  Assistive Device None  Range of Motion/Exercises Left leg;Right leg  Activity Response Tolerated well  Mobility Referral Yes  $Mobility charge 1 Mobility  Mobility Specialist Start Time (ACUTE ONLY) 0913  Mobility Specialist Stop Time (ACUTE ONLY) 0947  Mobility Specialist Time Calculation (min) (ACUTE ONLY) 34 min    Pt was agreeable to mobility session - received in bed. Refused OOB mobility d/t foot pain but agreeable for exercises. ModI for bed mobility. Completed 50x seated marches each leg (split btwn 2 sets), and ~30x knee kicks (2x15reps for R leg, 13reps for L leg). MS observed L leg to be weaker than R and more painful - unable to complete full reps of knee kicks, pt stated this was not baseline and requested more exercises to focus on L side of body. Also had c/o L knee pain, back pain from previous surgery, and bil foot neuropathy pain. Left in bed with all needs met, call bell in reach. Family in room.   Maurene Capes Mobility Specialist Please contact via SecureChat or Rehab office at [phone]

## 2023-08-09 NOTE — Progress Notes (Signed)
Per attending MD, wound dressing was removed for ID and vascular consult to see. And  wrap and wound care tonight.

## 2023-08-10 ENCOUNTER — Inpatient Hospital Stay (HOSPITAL_COMMUNITY): Payer: Medicare HMO

## 2023-08-10 DIAGNOSIS — L98499 Non-pressure chronic ulcer of skin of other sites with unspecified severity: Secondary | ICD-10-CM

## 2023-08-10 DIAGNOSIS — L0390 Cellulitis, unspecified: Secondary | ICD-10-CM | POA: Diagnosis not present

## 2023-08-10 LAB — VAS US ABI WITH/WO TBI
Left ABI: 1.05
Right ABI: 1.03

## 2023-08-10 LAB — ANTI-SMITH ANTIBODY: ENA SM Ab Ser-aCnc: <0.2 AI (ref 0.0–0.9)

## 2023-08-10 LAB — C3 COMPLEMENT: C3 Complement: 176 mg/dL — ABNORMAL HIGH (ref 82–167)

## 2023-08-10 LAB — C4 COMPLEMENT: Complement C4, Body Fluid: 27 mg/dL (ref 12–38)

## 2023-08-10 LAB — ANA W/REFLEX IF POSITIVE: Anti Nuclear Antibody (ANA): NEGATIVE

## 2023-08-10 LAB — RHEUMATOID FACTOR: Rheumatoid fact SerPl-aCnc: 11.2 [IU]/mL (ref <14.0)

## 2023-08-10 LAB — ANTI-DNA ANTIBODY, DOUBLE-STRANDED: ds DNA Ab: <1 [IU]/mL (ref 0–9)

## 2023-08-10 MED ORDER — DOXYCYCLINE HYCLATE 100 MG PO TABS
100.0000 mg | ORAL_TABLET | Freq: Two times a day (BID) | ORAL | Status: DC
  Administered 2023-08-10 – 2023-08-12 (×4): 100 mg via ORAL
  Filled 2023-08-10 (×4): qty 1

## 2023-08-10 MED ORDER — CEFADROXIL 500 MG PO CAPS
1000.0000 mg | ORAL_CAPSULE | Freq: Two times a day (BID) | ORAL | Status: DC
  Administered 2023-08-10 – 2023-08-12 (×4): 1000 mg via ORAL
  Filled 2023-08-10 (×4): qty 2

## 2023-08-10 MED ORDER — FUROSEMIDE 40 MG PO TABS
40.0000 mg | ORAL_TABLET | Freq: Every day | ORAL | Status: DC
  Administered 2023-08-10 – 2023-08-12 (×3): 40 mg via ORAL
  Filled 2023-08-10 (×3): qty 1

## 2023-08-10 NOTE — Progress Notes (Signed)
Patient requested wound care not to be done on this shift. Patient requested that it be done on next shift.

## 2023-08-10 NOTE — Progress Notes (Addendum)
PROGRESS NOTE    Vincent Cobb  VHQ:[id number] DOB: 1961-02-13 DOA: 08/04/2023 PCP: Melvyn Neth, NP    Brief Narrative:   Vincent Cobb is a 62 y.o. male with past medical history significant for recent diagnosis bladder/thyroid mass, HTN, peripheral venous insufficiency, chronic back pain, atrial flutter not on anticoagulation due to hematuria who presented to [facility] ED on 10/16 from [facility] in [place] for concern of cellulitis to bilateral lower extremities and feet.  Patient reports wounds present on his feet since July 2024 and has been following with wound care clinic in [place] over the last month.  He was seen at [facility] on 10/1 for bilateral lower extremity pain with workup with CT angiogram abdominal aorta with iliofemoral runoff with no significant arterial disease and was discharged home on clindamycin and Bactrim.  He re-presented to the ED due to significant burning pain to bilateral lower extremities with reported temperature at home of 100.0 F.  He endorses continued use of tobacco, 1 pack daily over the last 10 years.  In the ED, temperature 97.8 F, HR 128, RR 20, BP 123/78, SpO2 95% on room air.  WBC 10.3, hemoglobin 9.9, platelet count 243.  Sodium 137, potassium 4.1, chloride 106, CO2 22, glucose 129, BUN 17, creat 1.46.  AST 19, ALT 12, total bilirubin 0.9.  Lactic acid 1.0.  ESR 48.  COVID/influenza/RSV PCR negative.  Right foot x-ray with diffuse soft tissue swelling calf/foot, shallow ulceration medial aspect midfoot, proximal metatarsal shaft, no definite cortical erosion to indicate osteomyelitis.  Left foot x-ray with mild diffuse soft tissue swelling in the calf/foot, 2 small shallow ulcers dorsal midfoot, no cortical erosion to indicate osteomyelitis.  Chest x-ray with no acute cardiopulmonary disease process.  Patient was started on IV antibiotics.  TRH consulted for admission for further evaluation and management of bilateral lower extremity wounds with  concern for cellulitis.  Assessment & Plan:   Bilateral lower extremity wounds/ulcerations/cellulitis Patient presenting with persistent pain to bilateral lower extremities.  History of wounds x 1 month, follows with wound care clinic and [facility]  WBC count 11.3, ESR elevated 48 on admission with normal lactic acid.  Previous workup at [facility] 10/1 with CT angiogram abdominal aorta with iliofemoral runoff with no significant arterial disease.  X-rays on admission bilateral lower extremities with soft tissue edema, shallow erosions with no concerning findings for osteomyelitis.  MR left/right foot with shallow ulcerations, no definite cortical erosion or marrow edema to suggest osteomyelitis.  Acute hepatitis panel negative.  HIV nonreactive.  C4 27, within normal limits.  C3 elevated 176.  ANA negative.  Double-stranded DNA negative.  Anti-Smith antibody negative.  Rheumatoid factor within normal limits.  Vascular ultrasound lower extremities ABIs within normal limits. -- Infectious disease following, appreciate assistance -- Total complement: Pending -- Cryoglobin: Pending -- ANCA profile: Pending -- Doxycycline 100 mg p.o. every 12 hours -- Cefadroxil 1000 mg PO BID -- Gabapentin 300 mg p.o. 3 times daily -- CBC, ESR, CRP in the am  Wound care: Cleanse bilateral lower legs with soap and water and pat dry. Apply VASHE moist gauze to wound beds (weave VASHE moist kerlix between toes as well) top with dry gauze and wrap lower legs with kerlix/tape.  Change each shift  Essential hypertension -- Metoprolol succinate 100 mg p.o. daily -- Holding home losartan and furosemide for now  Hyperlipidemia -- Zetia 10 mg p.o. daily  Bladder tumor with hematuria Noted 2.4 cm hyperdense mass left  wall of the bladder on CT angiogram 07/21/2023. -- Tamsulosin 0.4 mg p.o. nightly -- Outpatient follow-up with urology  Atrial flutter Follow-up with cardiology outpatient.  Eliquis on  hold due to hematuria. --Metoprolol succinate 100 mg p.o. daily    DVT prophylaxis: SCDs Start: 08/04/23 2207    Code Status: Full Code Family Communication: Updated family members present at bedside this morning  Disposition Plan:  Level of care: Med-Surg Status is: Inpatient Remains inpatient appropriate because: IV antibiotics    Consultants:  Vascular surgery Infectious disease  Procedures:  None  Antimicrobials:  Vancomycin 10/16 - 10/21 Cefepime 10/16 - 10/21 Ceftriaxone 10/21 - 10/21 Metronidazole 10/21 - 10/21   Subjective: Patient seen examined bedside, resting calmly.  Lying in bed.  Family present.  Seen by infectious disease this morning, transition IV antibiotics to oral doxycycline and cefadroxil.  Discussed with patient that etiology of his lower extremity wounds likely secondary to microvascular disease from underlying tobacco abuse.  Discussed with him need for complete cessation/abstinence.  Autoimmune workup so far negative.  ABIs today within normal limits.  No other specific questions or concerns at this time.  Denies headache, no dizziness, no chest pain, no palpitations, no shortness of breath, no abdominal pain, no fever/chills/night sweats, no nausea cefonicid diarrhea, no focal weakness, no fatigue, no paresthesias.  No acute events overnight per nurse staff.  Objective: Vitals:   08/09/23 1417 08/09/23 1952 08/10/23 0424 08/10/23 0807  BP: (!) 145/90 (!) 141/85 (!) 137/90 119/66  Pulse:  96 (!) 114 92  Resp: 17   18  Temp: (!) 97.5 F (36.4 C) 97.8 F (36.6 C) 98.3 F (36.8 C) 100.1 F (37.8 C)  TempSrc: Oral Oral Oral Oral  SpO2: 99% 99% 99% 97%  Weight:      Height:        Intake/Output Summary (Last 24 hours) at 08/10/2023 1433 Last data filed at 08/10/2023 0300 Gross per 24 hour  Intake 1367.32 ml  Output --  Net 1367.32 ml   Filed Weights   08/04/23 1412  Weight: 112 kg    Examination:  Physical Exam: GEN: NAD, alert and  oriented x 3, obese, chronically ill appearance, appears older than stated age HEENT: NCAT, PERRL, EOMI, sclera clear, MMM PULM: CTAB w/o wheezes/crackles, normal respiratory effort, on room air CV: RRR w/o M/G/R GI: abd soft, NTND, NABS, no R/G/M MSK: no peripheral edema, muscle strength globally intact 5/5 bilateral upper/lower extremities NEURO: CN II-XII intact, no focal deficits, sensation to light touch intact PSYCH: normal mood/affect Integumentary: Bilateral lower extremity shallow ulcerations noted as depicted below, otherwise no other concerning rashes/lesions/wounds noted on exposed skin surfaces                Data Reviewed: I have personally reviewed following labs and imaging studies  CBC: Recent Labs  Lab 08/04/23 1401 08/05/23 0712 08/06/23 0813 08/07/23 0435 08/08/23 0935  WBC 10.3 9.6 9.9 9.0 10.3  NEUTROABS 8.0*  --   --   --   --   HGB 9.9* 10.3* 9.3* 8.9* 9.4*  HCT 30.4* 31.7* 28.5* 27.1* 28.8*  MCV 93.5 94.3 92.2 93.1 92.9  PLT 243 228 202 199 215   Basic Metabolic Panel: Recent Labs  Lab 08/04/23 1401 08/05/23 0712 08/06/23 0813 08/07/23 0435 08/08/23 0935  NA 137 139 140 138 138  K 4.1 4.1 3.7 3.4* 4.1  CL 106 104 106 106 107  CO2 22 21* 21* 20* 21*  GLUCOSE 129* 88 92  111* 103*  BUN 17 12 12 14 14   CREATININE 1.46* 1.03 1.10 1.28* 1.12  CALCIUM 9.4 9.8 10.1 9.0 9.5  MG  --   --   --   --  1.7  PHOS  --   --   --   --  3.3   GFR: Estimated Creatinine Clearance: 92.2 mL/min (by C-G formula based on SCr of 1.12 mg/dL). Liver Function Tests: Recent Labs  Lab 08/04/23 1401 08/08/23 0935  AST 19  --   ALT 12  --   ALKPHOS 105  --   BILITOT 0.6  --   PROT 8.0  --   ALBUMIN 3.5 2.8*   Recent Labs  Lab 08/04/23 1401  LIPASE 20   No results for input(s): "AMMONIA" in the last 168 hours. Coagulation Profile: Recent Labs  Lab 08/04/23 1533  INR 1.2   Cardiac Enzymes: No results for input(s): "CKTOTAL", "CKMB",  "CKMBINDEX", "TROPONINI" in the last 168 hours. BNP (last 3 results) No results for input(s): "PROBNP" in the last 8760 hours. HbA1C: No results for input(s): "HGBA1C" in the last 72 hours. CBG: Recent Labs  Lab 08/06/23 1131  GLUCAP 86   Lipid Profile: No results for input(s): "CHOL", "HDL", "LDLCALC", "TRIG", "CHOLHDL", "LDLDIRECT" in the last 72 hours. Thyroid Function Tests: No results for input(s): "TSH", "T4TOTAL", "FREET4", "T3FREE", "THYROIDAB" in the last 72 hours. Anemia Panel: No results for input(s): "VITAMINB12", "FOLATE", "FERRITIN", "TIBC", "IRON", "RETICCTPCT" in the last 72 hours. Sepsis Labs: Recent Labs  Lab 08/04/23 1411 08/04/23 1514  LATICACIDVEN 1.0 1.3    Recent Results (from the past 240 hour(s))  Blood culture (routine x 2)     Status: Abnormal   Collection Time: 08/04/23  1:28 PM   Specimen: BLOOD RIGHT HAND  Result Value Ref Range Status   Specimen Description BLOOD RIGHT HAND  Final   Special Requests   Final    BOTTLES DRAWN AEROBIC AND ANAEROBIC Blood Culture adequate volume   Culture  Setup Time   Final    GRAM POSITIVE COCCI IN CLUSTERS AEROBIC BOTTLE ONLY CRITICAL RESULT CALLED TO, READ BACK BY AND VERIFIED WITH: PHARMD NATHAN GOAD ON 08/06/23 @ 2037 BY DRT    Culture (A)  Final    MICROCOCCUS SPECIES Standardized susceptibility testing for this organism is not available. Performed at [facility], [address] [place], [place] [address]    Report Status 08/08/2023 FINAL  Final  Blood Culture ID Panel (Reflexed)     Status: None   Collection Time: 08/04/23  1:28 PM  Result Value Ref Range Status   Enterococcus faecalis NOT DETECTED NOT DETECTED Final   Enterococcus Faecium NOT DETECTED NOT DETECTED Final   Listeria monocytogenes NOT DETECTED NOT DETECTED Final   Staphylococcus species NOT DETECTED NOT DETECTED Final   Staphylococcus aureus (BCID) NOT DETECTED NOT DETECTED Final   Staphylococcus epidermidis NOT DETECTED NOT  DETECTED Final   Staphylococcus lugdunensis NOT DETECTED NOT DETECTED Final   Streptococcus species NOT DETECTED NOT DETECTED Final   Streptococcus agalactiae NOT DETECTED NOT DETECTED Final   Streptococcus pneumoniae NOT DETECTED NOT DETECTED Final   Streptococcus pyogenes NOT DETECTED NOT DETECTED Final   A.calcoaceticus-baumannii NOT DETECTED NOT DETECTED Final   Bacteroides fragilis NOT DETECTED NOT DETECTED Final   Enterobacterales NOT DETECTED NOT DETECTED Final   Enterobacter cloacae complex NOT DETECTED NOT DETECTED Final   Escherichia coli NOT DETECTED NOT DETECTED Final   Klebsiella aerogenes NOT DETECTED NOT DETECTED  Final   Klebsiella oxytoca NOT DETECTED NOT DETECTED Final   Klebsiella pneumoniae NOT DETECTED NOT DETECTED Final   Proteus species NOT DETECTED NOT DETECTED Final   Salmonella species NOT DETECTED NOT DETECTED Final   Serratia marcescens NOT DETECTED NOT DETECTED Final   Haemophilus influenzae NOT DETECTED NOT DETECTED Final   Neisseria meningitidis NOT DETECTED NOT DETECTED Final   Pseudomonas aeruginosa NOT DETECTED NOT DETECTED Final   Stenotrophomonas maltophilia NOT DETECTED NOT DETECTED Final   Candida albicans NOT DETECTED NOT DETECTED Final   Candida auris NOT DETECTED NOT DETECTED Final   Candida glabrata NOT DETECTED NOT DETECTED Final   Candida krusei NOT DETECTED NOT DETECTED Final   Candida parapsilosis NOT DETECTED NOT DETECTED Final   Candida tropicalis NOT DETECTED NOT DETECTED Final   Cryptococcus neoformans/gattii NOT DETECTED NOT DETECTED Final    Comment: Performed at [facility], [address], [place], [place] [address]  Blood culture (routine x 2)     Status: None   Collection Time: 08/04/23  1:57 PM   Specimen: BLOOD RIGHT FOREARM  Result Value Ref Range Status   Specimen Description BLOOD RIGHT FOREARM  Final   Special Requests   Final    BOTTLES DRAWN AEROBIC AND ANAEROBIC Blood Culture results may not be optimal due to an  excessive volume of blood received in culture bottles   Culture   Final    NO GROWTH 5 DAYS Performed at [facility], [address], [place], [place] [address]    Report Status 08/09/2023 FINAL  Final  Resp panel by RT-PCR (RSV, Flu A&B, Covid) Anterior Nasal Swab     Status: None   Collection Time: 08/04/23  3:33 PM   Specimen: Anterior Nasal Swab  Result Value Ref Range Status   SARS Coronavirus 2 by RT PCR NEGATIVE NEGATIVE Final   Influenza A by PCR NEGATIVE NEGATIVE Final   Influenza B by PCR NEGATIVE NEGATIVE Final    Comment: (NOTE) The Xpert Xpress SARS-CoV-2/FLU/RSV plus assay is intended as an aid in the diagnosis of influenza from Nasopharyngeal swab specimens and should not be used as a sole basis for treatment. Nasal washings and aspirates are unacceptable for Xpert Xpress SARS-CoV-2/FLU/RSV testing.  Fact Sheet for Patients: BloggerCourse.com  Fact Sheet for Healthcare Providers: SeriousBroker.it  This test is not yet approved or cleared by the [place] FDA and has been authorized for detection and/or diagnosis of SARS-CoV-2 by FDA under an Emergency Use Authorization (EUA). This EUA will remain in effect (meaning this test can be used) for the duration of the COVID-19 declaration under Section 564(b)(1) of the Act, 21 U.S.C. section 360bbb-3(b)(1), unless the authorization is terminated or revoked.     Resp Syncytial Virus by PCR NEGATIVE NEGATIVE Final    Comment: (NOTE) Fact Sheet for Patients: BloggerCourse.com  Fact Sheet for Healthcare Providers: SeriousBroker.it  This test is not yet approved or cleared by the [place] FDA and has been authorized for detection and/or diagnosis of SARS-CoV-2 by FDA under an Emergency Use Authorization (EUA). This EUA will remain in effect (meaning this test can be used) for the duration of the COVID-19  declaration under Section 564(b)(1) of the Act, 21 U.S.C. section 360bbb-3(b)(1), unless the authorization is terminated or revoked.  Performed at [facility], [address], [place], [place] [address]   Culture, blood (Routine X 2) w Reflex to ID Panel     Status: None (Preliminary result)  Collection Time: 08/09/23  3:19 PM   Specimen: BLOOD LEFT HAND  Result Value Ref Range Status   Specimen Description BLOOD LEFT HAND  Final   Special Requests   Final    BOTTLES DRAWN AEROBIC ONLY Blood Culture results may not be optimal due to an inadequate volume of blood received in culture bottles   Culture   Final    NO GROWTH < 24 HOURS Performed at [facility], [address] [place], [place] [address]    Report Status PENDING  Incomplete  Culture, blood (Routine X 2) w Reflex to ID Panel     Status: None (Preliminary result)   Collection Time: 08/09/23  3:19 PM   Specimen: BLOOD LEFT ARM  Result Value Ref Range Status   Specimen Description BLOOD LEFT ARM  Final   Special Requests   Final    BOTTLES DRAWN AEROBIC ONLY Blood Culture results may not be optimal due to an inadequate volume of blood received in culture bottles   Culture   Final    NO GROWTH < 24 HOURS Performed at [facility], [address], [place], [place] [address]    Report Status PENDING  Incomplete         Radiology Studies: VAS [place] ABI WITH/WO TBI  Result Date: 08/10/2023  LOWER EXTREMITY DOPPLER STUDY Patient Name:  SAVERIO SORTO  Date of Exam:   08/10/2023 Medical Rec #: [id number]         Accession #:    [id number] Date of Birth: [birth year]         Patient Gender: M Patient Age:   76 years Exam Location:  [facility] Procedure:      VAS [place] ABI WITH/WO TBI Referring Phys: JOSHUA ROBINS --------------------------------------------------------------------------------  Indications: Ulceration. High Risk Factors: Hypertension.  Limitations: Today's exam was limited due to patient  intolerant to cuff              pressure, an open wound, bandages and involuntary patient movement. Comparison Study: No prior studies. Performing Technologist: Olen Cordial RVT  Examination Guidelines: A complete evaluation includes at minimum, Doppler waveform signals and systolic blood pressure reading at the level of bilateral brachial, anterior tibial, and posterior tibial arteries, when vessel segments are accessible. Bilateral testing is considered an integral part of a complete examination. Photoelectric Plethysmograph (PPG) waveforms and toe systolic pressure readings are included as required and additional duplex testing as needed. Limited examinations for reoccurring indications may be performed as noted.  ABI Findings: +--------+------------------+-----+-----------+--------+ Right   Rt Pressure (mmHg)IndexWaveform   Comment  +--------+------------------+-----+-----------+--------+ [id number]                    triphasic           +--------+------------------+-----+-----------+--------+ PTA     135               1.03 multiphasic         +--------+------------------+-----+-----------+--------+ DP      133               1.02 triphasic           +--------+------------------+-----+-----------+--------+ +--------+------------------+-----+-----------+-------+ Left    Lt Pressure (mmHg)IndexWaveform   Comment +--------+------------------+-----+-----------+-------+ [id number]                    triphasic          +--------+------------------+-----+-----------+-------+ PTA     137  1.05 multiphasic        +--------+------------------+-----+-----------+-------+ DP      129               0.98 multiphasic        +--------+------------------+-----+-----------+-------+ +-------+-----------+-----------+------------+------------+ ABI/TBIToday's ABIToday's TBIPrevious ABIPrevious TBI +-------+-----------+-----------+------------+------------+ Right   1.03                                           +-------+-----------+-----------+------------+------------+ Left   1.05                                           +-------+-----------+-----------+------------+------------+  Summary: Right: Resting right ankle-brachial index is within normal range. Unable to obtain TBI due to toe ulcerations. Left: Resting left ankle-brachial index is within normal range. Unable to obtain TBI due to toe ulcerations. *See table(s) above for measurements and observations.     Preliminary         Scheduled Meds:  baclofen  10 mg Oral TID   busPIRone  15 mg Oral BID   cefadroxil  1,000 mg Oral BID   doxycycline  100 mg Oral Q12H   ezetimibe  10 mg Oral Daily   feeding supplement  237 mL Oral BID BM   gabapentin  300 mg Oral TID   metoprolol succinate  100 mg Oral Daily   pantoprazole  80 mg Oral Daily   tamsulosin  0.4 mg Oral QPC supper   Continuous Infusions:   LOS: 6 days    Time spent: 52 minutes spent on chart review, discussion with nursing staff, consultants, updating family and interview/physical exam; more than 50% of that time was spent in counseling and/or coordination of care.    Alvira Philips [place], DO [facility] Available via Epic secure chat 7am-7pm After these hours, please refer to coverage provider listed on [email or web address] 08/10/2023, 2:33 PM

## 2023-08-10 NOTE — Progress Notes (Signed)
[facility]  Date of Admission:  08/04/2023   Total days of inpatient antibiotics 5  Principal Problem:   Wound cellulitis Active Problems:   AKI (acute kidney injury) (HCC)   Normocytic anemia   Bladder mass   Atrial flutter (HCC)   Tobacco use   Chronic back pain          Assessment: 62 year old male with recently diagnosed bladder mass, thyroid mass and followed by wound care for bilateral lower extremity swelling starting this year then seen in the ED on 10/1 where he was prescribed clindamycin and Bactrim for lower extremity wounds admitted with: #Lower lower extremity wounds suspicious for cellulitis #Opiate dependence, 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  Right foot noted subchondral cyst - Patient notes that his UDS is positive due to taking his leftover methadone.  He notes that he was followed at methadone clinic for years for opioid dependence(pills) following back surgery/back pain. -She has been on antibiotics since admission with Vanco, cefepime, metronidazole without improvement of wounds.  His also had some low-grade fevers hospitalization. Recommendations:  -D/C vancomycin and ceftriaxone -Start doxy and cefadroxil. His wounds do not seem - Follow hepatitis panel, autoimmune negative except for C3 complement elevated, ANCA pending, cryoglobulin, eser and crp - His clinical picture including MRI, no leukocytosis, minimal edema around ulcerations not appear consistent with cellulitis/infection.  Clinical picture is more consistent with some sort of inflammatory change vs malignancy?.  Ideally, would like a biopsy for further characterization as wounds have not improved on antibiotics.   #Bladder mass - Follows with urology in [place]   #Blood cultures 1/4 micrococcus - Consistent with contamination   Microbiology:   Antibiotics: Vancomycin, cefepime  10/16-present Metronidazole 10/21   Cultures: Blood 10/61/4 micrococcus 10/21 pending   SUBJECTIVE: Resting in bed.  Interval: tmax 100.21 overnight  Review of Systems: Review of Systems  All other systems reviewed and are negative.    Scheduled Meds:  baclofen  10 mg Oral TID   busPIRone  15 mg Oral BID   cefadroxil  1,000 mg Oral BID   doxycycline  100 mg Oral Q12H   ezetimibe  10 mg Oral Daily   feeding supplement  237 mL Oral BID BM   furosemide  40 mg Oral Daily   gabapentin  300 mg Oral TID   metoprolol succinate  100 mg Oral Daily   pantoprazole  80 mg Oral Daily   tamsulosin  0.4 mg Oral QPC supper   Continuous Infusions: PRN Meds:.acetaminophen, ALPRAZolam, cyclobenzaprine, HYDROmorphone (DILAUDID) injection, metoprolol tartrate, oxyCODONE-acetaminophen Allergies  Allergen Reactions   Celebrex [Celecoxib] Anaphylaxis   Simvastatin Other (See Comments)    Muscle pain   Vioxx [Rofecoxib]     OBJECTIVE: Vitals:   08/10/23 0424 08/10/23 0807 08/10/23 1445 08/10/23 2002  BP: (!) 137/90 119/66 137/89 116/88  Pulse: (!) 114 92 87 91  Resp:  18 18 18   Temp: 98.3 F (36.8 C) 100.1 F (37.8 C) 98.9 F (37.2 C) 98.1 F (36.7 C)  TempSrc: Oral Oral Oral Oral  SpO2: 99% 97% 100%   Weight:      Height:       Body mass index is 31.71 kg/m.  Physical Exam Constitutional:      General: He is not in acute distress.    Appearance: He is normal weight. He is not toxic-appearing.  HENT:  Head: Normocephalic and atraumatic.     Right Ear: External ear normal.     Left Ear: External ear normal.     Nose: No congestion or rhinorrhea.     Mouth/Throat:     Mouth: Mucous membranes are moist.     Pharynx: Oropharynx is clear.  Eyes:     Extraocular Movements: Extraocular movements intact.     Conjunctiva/sclera: Conjunctivae normal.     Pupils: Pupils are equal, round, and reactive to light.  Cardiovascular:     Rate and Rhythm: Normal rate and regular  rhythm.     Heart sounds: No murmur heard.    No friction rub. No gallop.  Pulmonary:     Effort: Pulmonary effort is normal.     Breath sounds: Normal breath sounds.  Abdominal:     General: Abdomen is flat. Bowel sounds are normal.     Palpations: Abdomen is soft.  Musculoskeletal:        General: No swelling.     Cervical back: Normal range of motion and neck supple.     Comments: B/l feet bandaged  Skin:    General: Skin is warm and dry.  Neurological:     General: No focal deficit present.     Mental Status: He is oriented to person, place, and time.  Psychiatric:        Mood and Affect: Mood normal.       Lab Results Lab Results  Component Value Date   WBC 10.3 08/08/2023   HGB 9.4 (L) 08/08/2023   HCT 28.8 (L) 08/08/2023   MCV 92.9 08/08/2023   PLT 215 08/08/2023    Lab Results  Component Value Date   CREATININE 1.12 08/08/2023   BUN 14 08/08/2023   NA 138 08/08/2023   K 4.1 08/08/2023   CL 107 08/08/2023   CO2 21 (L) 08/08/2023    Lab Results  Component Value Date   ALT 12 08/04/2023   AST 19 08/04/2023   ALKPHOS 105 08/04/2023   BILITOT 0.6 08/04/2023        Danelle Earthly, MD [facility] 08/10/2023, 9:43 PM I have personally spent 52 minutes involved in face-to-face and non-face-to-face activities for this patient on the day of the visit. Professional time spent includes the following activities: Preparing to see the patient (review of tests), Obtaining and/or reviewing separately obtained history (admission/discharge record), Performing a medically appropriate examination and/or evaluation , Ordering medications/tests/procedures, referring and communicating with other health care professionals, Documenting clinical information in the EMR, Independently interpreting results (not separately reported), Communicating results to the patient/family/caregiver, Counseling and educating the  patient/family/caregiver and Care coordination (not separately reported).

## 2023-08-10 NOTE — Plan of Care (Signed)

## 2023-08-10 NOTE — Progress Notes (Signed)
CSW spoke with pt and fiancee Steward Drone regarding SDOH: utilities.  Pt reports that he had an issue with his well but he and his son recently replaced all of the pipes from the well to the house and it is now working properly.  No current need. Daleen Squibb, MSW, LCSW 10/22/20242:47 PM

## 2023-08-10 NOTE — Progress Notes (Signed)
Patient requested nurse to removed nicotine patch because patient believed it was causing him to have tremors of hands which patient stated was not experiencing before starting to wear the patch according to patient.

## 2023-08-10 NOTE — Progress Notes (Signed)
Mobility Specialist: Progress Note   08/10/23 1327  Mobility  Activity Dangled on edge of bed (+ exercises)  Level of Assistance Independent  Assistive Device Other (Comment) (theraband)  Range of Motion/Exercises All extremities;Active Assistive  Activity Response Tolerated well  Mobility Referral Yes  $Mobility charge 1 Mobility  Mobility Specialist Start Time (ACUTE ONLY) 1125  Mobility Specialist Stop Time (ACUTE ONLY) 1144  Mobility Specialist Time Calculation (min) (ACUTE ONLY) 19 min    Pt was agreeable to mobility session - received in bed. Educated pt on arm, core, and leg strengthening exercises using yellow theraband. Pt performed 60x bicep curls (2x15reps each arm) and 20x diagonal upward punches(10x each side) with the theraband at bed level. Pt then sat EOB and performed 20x outward arm swings with the theraband (10x each side) and 30x seated marches (15x each leg). Had c/o fatigue and low back pain that increased with physical activity. Left on EOB with all needs met, call bell in reach.   Vincent Cobb Mobility Specialist Please contact via SecureChat or Rehab office at [phone]

## 2023-08-10 NOTE — Progress Notes (Signed)
ABI's have been completed. Preliminary results can be found in CV Proc through chart review.   08/10/23 1:20 PM Olen Cordial RVT

## 2023-08-10 NOTE — Plan of Care (Signed)

## 2023-08-11 DIAGNOSIS — L0390 Cellulitis, unspecified: Secondary | ICD-10-CM | POA: Diagnosis not present

## 2023-08-11 LAB — BASIC METABOLIC PANEL
Anion gap: 9 (ref 5–15)
BUN: 21 mg/dL (ref 8–23)
CO2: 21 mmol/L — ABNORMAL LOW (ref 22–32)
Calcium: 9.2 mg/dL (ref 8.9–10.3)
Chloride: 105 mmol/L (ref 98–111)
Creatinine, Ser: 1.36 mg/dL — ABNORMAL HIGH (ref 0.61–1.24)
GFR, Estimated: 59 mL/min — ABNORMAL LOW (ref >60)
Glucose, Bld: 98 mg/dL (ref 70–99)
Potassium: 4 mmol/L (ref 3.5–5.1)
Sodium: 135 mmol/L (ref 135–145)

## 2023-08-11 LAB — CBC
HCT: 29.4 % — ABNORMAL LOW (ref 39.0–52.0)
Hemoglobin: 9.9 g/dL — ABNORMAL LOW (ref 13.0–17.0)
MCH: 30.6 pg (ref 26.0–34.0)
MCHC: 33.7 g/dL (ref 30.0–36.0)
MCV: 90.7 fL (ref 80.0–100.0)
Platelets: 249 10*3/uL (ref 150–400)
RBC: 3.24 MIL/uL — ABNORMAL LOW (ref 4.22–5.81)
RDW: 14.3 % (ref 11.5–15.5)
WBC: 9.8 10*3/uL (ref 4.0–10.5)
nRBC: 0 % (ref 0.0–0.2)

## 2023-08-11 LAB — C-REACTIVE PROTEIN: CRP: 4.7 mg/dL — ABNORMAL HIGH (ref <1.0)

## 2023-08-11 LAB — ANCA PROFILE
Anti-MPO Antibodies: <0.2 U (ref 0.0–0.9)
Anti-PR3 Antibodies: <0.2 U (ref 0.0–0.9)
Atypical P-ANCA titer: <1:20 {titer}
C-ANCA: <1:20 {titer}
P-ANCA: <1:20 {titer}

## 2023-08-11 LAB — COMPLEMENT, TOTAL: Compl, Total (CH50): >60 U/mL (ref >41)

## 2023-08-11 LAB — MAGNESIUM: Magnesium: 1.9 mg/dL (ref 1.7–2.4)

## 2023-08-11 LAB — SEDIMENTATION RATE: Sed Rate: 65 mm/h — ABNORMAL HIGH (ref 0–16)

## 2023-08-11 MED ORDER — PREDNISONE 20 MG PO TABS
60.0000 mg | ORAL_TABLET | Freq: Every day | ORAL | Status: DC
  Administered 2023-08-11 – 2023-08-12 (×2): 60 mg via ORAL
  Filled 2023-08-11 (×2): qty 3

## 2023-08-11 NOTE — Consult Note (Signed)
Hospital Consult    Reason for Consult: Bilateral lower extremity lesions Requesting Physician: Hospital medicine MRN #:  [id number]  History of Present Illness: This is a 62 y.o. male who presented to the hospital with progression in bilateral lower extremity lesions over the last several weeks.  Evidence history of bladder mass, thyroid mass, hypertension, chronic back pain, venous insufficiency and a flutter-not on anticoagulation.  He was admitted for bilateral lower extremity foot wounds of unknown etiology.  Vascular surgery was called to rule out peripheral arterial disease.   On exam, Vincent Cobb was resting comfortably he noted severe pain in bilateral feet.  He said the lesion started out as small red bumps, and then have continued to worsen, eating only at the dermis and enlarging in size.  Denies fevers, chills   Past Medical History:  Diagnosis Date   Cancer (HCC)    thyroid and bladder   Hypertension    Neuropathy    Tachycardia     Past Surgical History:  Procedure Laterality Date   BACK SURGERY     CYSTOSCOPY      Allergies  Allergen Reactions   Celebrex [Celecoxib] Anaphylaxis   Simvastatin Other (See Comments)    Muscle pain   Vioxx [Rofecoxib]     Prior to Admission medications   Medication Sig Start Date End Date Taking? Authorizing Provider  busPIRone (BUSPAR) 15 MG tablet Take 15 mg by mouth 2 (two) times daily.   Yes [provider]  EPINEPHrine 0.1 MG/0.1ML SOAJ Inject 0.3 mg into the muscle as needed (Anaphylaxis).   Yes [provider]  fluticasone (FLONASE) 50 MCG/ACT nasal spray Place 2 sprays into both nostrils daily as needed for allergies.   Yes [provider]  furosemide (LASIX) 40 MG tablet Take 1.5 tablets by mouth daily.   Yes [provider]  losartan (COZAAR) 50 MG tablet Take 50 mg by mouth 2 (two) times daily.   Yes [provider]  metoprolol succinate (TOPROL-XL) 50 MG 24 hr tablet Take 50  mg by mouth in the morning and at bedtime. Take with or immediately following a meal.   Yes [provider]  Vitamin D, Ergocalciferol, (DRISDOL) 1.25 MG (50000 UNIT) CAPS capsule Take 50,000 Units by mouth once a week. 04/21/23  Yes [provider]  clindamycin (CLEOCIN T) 1 % lotion Apply 1 Application topically 2 (two) times daily. Patient not taking: Reported on 08/04/2023 07/21/23 07/20/24  [provider]  gabapentin (NEURONTIN) 100 MG capsule Take 300 mg by mouth daily. Patient not taking: Reported on 08/04/2023    [provider]    Social History   Socioeconomic History   Marital status: Single    Spouse name: Not on file   Number of children: Not on file   Years of education: Not on file   Highest education level: Not on file  Occupational History   Not on file  Tobacco Use   Smoking status: Every Day    Types: Cigarettes   Smokeless tobacco: Never  Substance and Sexual Activity   Alcohol use: Yes    Comment: occ use   Drug use: Never   Sexual activity: Yes  Other Topics Concern   Not on file  Social History Narrative   Not on file   Social Determinants of Health   Financial Resource Strain: Not on file  Food Insecurity: No Food Insecurity (08/04/2023)   Hunger Vital Sign    Worried About Running Out of Food in  the Last Year: Never true    Ran Out of Food in the Last Year: Never true  Transportation Needs: No Transportation Needs (08/04/2023)   PRAPARE - Administrator, Civil Service (Medical): No    Lack of Transportation (Non-Medical): No  Physical Activity: Not on file  Stress: Not on file  Social Connections: Not on file  Intimate Partner Violence: Not At Risk (08/04/2023)   Humiliation, Afraid, Rape, and Kick questionnaire    Fear of Current or Ex-Partner: No    Emotionally Abused: No    Physically Abused: No    Sexually Abused: No    History reviewed. No pertinent family history.  ROS: Otherwise  negative unless mentioned in HPI  Physical Examination  Vitals:   08/11/23 0539 08/11/23 0817  BP: 112/74 (!) 138/91  Pulse: 98 (!) 106  Resp: 18   Temp: 98 F (36.7 C) 98.2 F (36.8 C)  SpO2: 97% 99%   Body mass index is 31.71 kg/m.  General:  WDWN in NAD Gait: Not observed HENT: WNL, normocephalic Pulmonary: normal non-labored breathing, without Rales, rhonchi,  wheezing Cardiac: regular Abdomen:  soft, NT/ND, no masses Skin: without rashes Vascular Exam/Pulses: 2+ femorals bilaterally, 2+ right dorsalis pedis, 2+ left dorsalis pedis Extremities: without ischemic changes, without Gangrene , with cellulitis; with open wounds;  Musculoskeletal: no muscle wasting or atrophy  Neurologic: A&O X 3;  No focal weakness or paresthesias are detected; speech is fluent/normal Psychiatric:  The pt has Normal affect. Lymph:  Unremarkable  CBC    Component Value Date/Time   WBC 9.8 08/11/2023 0545   RBC 3.24 (L) 08/11/2023 0545   HGB 9.9 (L) 08/11/2023 0545   HCT 29.4 (L) 08/11/2023 0545   PLT 249 08/11/2023 0545   MCV 90.7 08/11/2023 0545   MCH 30.6 08/11/2023 0545   MCHC 33.7 08/11/2023 0545   RDW 14.3 08/11/2023 0545   LYMPHSABS 1.2 08/04/2023 1401   MONOABS 0.6 08/04/2023 1401   EOSABS 0.3 08/04/2023 1401   BASOSABS 0.1 08/04/2023 1401    BMET    Component Value Date/Time   NA 135 08/11/2023 0545   K 4.0 08/11/2023 0545   CL 105 08/11/2023 0545   CO2 21 (L) 08/11/2023 0545   GLUCOSE 98 08/11/2023 0545   BUN 21 08/11/2023 0545   CREATININE 1.36 (H) 08/11/2023 0545   CALCIUM 9.2 08/11/2023 0545   GFRNONAA 59 (L) 08/11/2023 0545    COAGS: Lab Results  Component Value Date   INR 1.2 08/04/2023    ASSESSMENT/PLAN: This is a 62 y.o. male with wounds to bilateral lower extremities.  These progressed over the last several weeks.  On physical exam, their distribution was atypical of peripheral arterial disease.  For the more he had palpable pulses in the feet. ABIs  were normal.   Unsure of etiology at this time.  Recommend vasculitis workup.  Would benefit from biopsy as well.   Victorino Sparrow MD MS [facility] [phone] Patient seen on 08/09/2023.  ABI reviewed today.

## 2023-08-11 NOTE — Plan of Care (Signed)

## 2023-08-11 NOTE — Progress Notes (Signed)
PROGRESS NOTE    Vincent Cobb  MVH:[id number] DOB: Feb 02, 1961 DOA: 08/04/2023 PCP: Melvyn Neth, NP    Brief Narrative:   Vincent Cobb is a 62 y.o. male with past medical history significant for recent diagnosis bladder/thyroid mass, HTN, peripheral venous insufficiency, chronic back pain, atrial flutter not on anticoagulation due to hematuria who presented to [facility] ED on 10/16 from [facility] in [place] for concern of cellulitis to bilateral lower extremities and feet.  Patient reports wounds present on his feet since July 2024 and has been following with wound care clinic in [place] over the last month.  He was seen at [facility] on 10/1 for bilateral lower extremity pain with workup with CT angiogram abdominal aorta with iliofemoral runoff with no significant arterial disease and was discharged home on clindamycin and Bactrim.  He re-presented to the ED due to significant burning pain to bilateral lower extremities with reported temperature at home of 100.0 F.  He endorses continued use of tobacco, 1 pack daily over the last 10 years.  In the ED, temperature 97.8 F, HR 128, RR 20, BP 123/78, SpO2 95% on room air.  WBC 10.3, hemoglobin 9.9, platelet count 243.  Sodium 137, potassium 4.1, chloride 106, CO2 22, glucose 129, BUN 17, creat 1.46.  AST 19, ALT 12, total bilirubin 0.9.  Lactic acid 1.0.  ESR 48.  COVID/influenza/RSV PCR negative.  Right foot x-ray with diffuse soft tissue swelling calf/foot, shallow ulceration medial aspect midfoot, proximal metatarsal shaft, no definite cortical erosion to indicate osteomyelitis.  Left foot x-ray with mild diffuse soft tissue swelling in the calf/foot, 2 small shallow ulcers dorsal midfoot, no cortical erosion to indicate osteomyelitis.  Chest x-ray with no acute cardiopulmonary disease process.  Patient was started on IV antibiotics.  TRH consulted for admission for further evaluation and management of bilateral lower extremity wounds with  concern for cellulitis.  Assessment & Plan:   Bilateral lower extremity wounds/ulcerations/cellulitis Patient presenting with persistent pain to bilateral lower extremities.  History of wounds x 1 month, follows with wound care clinic and [facility].  WBC count 11.3, ESR elevated 48 on admission with normal lactic acid.  Previous workup at [facility] 10/1 with CT angiogram abdominal aorta with iliofemoral runoff with no significant arterial disease.  X-rays on admission bilateral lower extremities with soft tissue edema, shallow erosions with no concerning findings for osteomyelitis.  MR left/right foot with shallow ulcerations, no definite cortical erosion or marrow edema to suggest osteomyelitis.  Acute hepatitis panel negative.  HIV nonreactive.  Total complement within normal limits.  C4 27, within normal limits.  C3 elevated 176.  ANA negative.  Double-stranded DNA negative.  Anti-Smith antibody negative.  Rheumatoid factor within normal limits.  Vascular ultrasound lower extremities ABIs within normal limits.  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 -- Infectious disease following, appreciate assistance -- Cryoglobin: Pending -- ANCA profile: Pending -- Doxycycline 100 mg p.o. every 12 hours -- Cefadroxil 1000 mg PO BID -- Gabapentin 300 mg p.o. 3 times daily -- Start prednisone 60 mg p.o. daily  Wound care: Cleanse bilateral lower legs with soap and water and pat dry. Apply VASHE moist gauze to wound beds (weave VASHE moist kerlix between toes as well) top with dry gauze and wrap lower legs with kerlix/tape.  Change each shift  Essential hypertension -- Metoprolol succinate 100 mg p.o. daily -- Furosemide 40 mg p.o. daily -- Holding  home losartan   Hyperlipidemia -- Zetia 10 mg p.o. daily  Bladder tumor with hematuria Noted 2.4 cm hyperdense mass left wall of the  bladder on CT angiogram 07/21/2023. -- Tamsulosin 0.4 mg p.o. nightly -- Outpatient follow-up with urology; patient requesting second opinion, will give information [facility]  Atrial flutter Follow-up with cardiology outpatient.  Eliquis on hold due to hematuria. -- Metoprolol succinate 100 mg p.o. daily    DVT prophylaxis: SCDs Start: 08/04/23 2207    Code Status: Full Code Family Communication: Updated family members present at bedside this morning  Disposition Plan:  Level of care: Med-Surg Status is: Inpatient Remains inpatient appropriate because: Anticipate discharge home tomorrow    Consultants:  Vascular surgery Infectious disease  Procedures:  None  Antimicrobials:  Vancomycin 10/16 - 10/21 Cefepime 10/16 - 10/21 Ceftriaxone 10/21 - 10/21 Metronidazole 10/21 - 10/21 Doxycycline 10/22>> Cefadroxil 10/22>>   Subjective: Patient seen examined bedside, resting calmly.  Lying in bed.  Significant other present.  Seen by infectious disease, changed to oral antibiotics yesterday.  Discussed with general surgery, unable to perform biopsy and defer to infectious disease.  Infectious disease deferring biopsy to outpatient.  Discussed with family will continue antibiotics, will start prednisone today for possible inflammatory component such as pyoderma gangrenosum.  Continue to encourage tobacco cessation/abstinence.  Autoimmune workup so far negative.  ABIs today within normal limits.  No other specific questions or concerns at this time.  Denies headache, no dizziness, no chest pain, no palpitations, no shortness of breath, no abdominal pain, no fever/chills/night sweats, no nausea cefonicid diarrhea, no focal weakness, no fatigue, no paresthesias.  No acute events overnight per nursing staff.  Objective: Vitals:   08/10/23 1445 08/10/23 2002 08/11/23 0539 08/11/23 0817  BP: 137/89 116/88 112/74 (!) 138/91  Pulse: 87 91 98 (!) 106  Resp: 18 18 18     Temp: 98.9 F (37.2 C) 98.1 F (36.7 C) 98 F (36.7 C) 98.2 F (36.8 C)  TempSrc: Oral Oral  Oral  SpO2: 100%  97% 99%  Weight:      Height:        Intake/Output Summary (Last 24 hours) at 08/11/2023 1415 Last data filed at 08/11/2023 0840 Gross per 24 hour  Intake 240 ml  Output --  Net 240 ml   Filed Weights   08/04/23 1412  Weight: 112 kg    Examination:  Physical Exam: GEN: NAD, alert and oriented x 3, obese, chronically ill appearance, appears older than stated age HEENT: NCAT, PERRL, EOMI, sclera clear, MMM PULM: CTAB w/o wheezes/crackles, normal respiratory effort, on room air CV: RRR w/o M/G/R GI: abd soft, NTND, NABS, no R/G/M MSK: no peripheral edema, muscle strength globally intact 5/5 bilateral upper/lower extremities NEURO: CN II-XII intact, no focal deficits, sensation to light touch intact PSYCH: normal mood/affect Integumentary: Bilateral lower extremity shallow ulcerations noted as depicted below, otherwise no other concerning rashes/lesions/wounds noted on exposed skin surfaces                Data Reviewed: I have personally reviewed following labs and imaging studies  CBC: Recent Labs  Lab 08/05/23 0712 08/06/23 0813 08/07/23 0435 08/08/23 0935 08/11/23 0545  WBC 9.6 9.9 9.0 10.3 9.8  HGB 10.3* 9.3* 8.9* 9.4* 9.9*  HCT 31.7* 28.5* 27.1* 28.8* 29.4*  MCV 94.3 92.2 93.1 92.9 90.7  PLT 228 202 199 215 249   Basic Metabolic Panel: Recent Labs  Lab 08/05/23 0712 08/06/23 0813 08/07/23 0435  08/08/23 0935 08/11/23 0545  NA 139 140 138 138 135  K 4.1 3.7 3.4* 4.1 4.0  CL 104 106 106 107 105  CO2 21* 21* 20* 21* 21*  GLUCOSE 88 92 111* 103* 98  BUN 12 12 14 14 21   CREATININE 1.03 1.10 1.28* 1.12 1.36*  CALCIUM 9.8 10.1 9.0 9.5 9.2  MG  --   --   --  1.7 1.9  PHOS  --   --   --  3.3  --    GFR: Estimated Creatinine Clearance: 75.9 mL/min (A) (by C-G formula based on SCr of 1.36 mg/dL (H)). Liver Function Tests: Recent  Labs  Lab 08/08/23 0935  ALBUMIN 2.8*   No results for input(s): "LIPASE", "AMYLASE" in the last 168 hours.  No results for input(s): "AMMONIA" in the last 168 hours. Coagulation Profile: Recent Labs  Lab 08/04/23 1533  INR 1.2   Cardiac Enzymes: No results for input(s): "CKTOTAL", "CKMB", "CKMBINDEX", "TROPONINI" in the last 168 hours. BNP (last 3 results) No results for input(s): "PROBNP" in the last 8760 hours. HbA1C: No results for input(s): "HGBA1C" in the last 72 hours. CBG: Recent Labs  Lab 08/06/23 1131  GLUCAP 86   Lipid Profile: No results for input(s): "CHOL", "HDL", "LDLCALC", "TRIG", "CHOLHDL", "LDLDIRECT" in the last 72 hours. Thyroid Function Tests: No results for input(s): "TSH", "T4TOTAL", "FREET4", "T3FREE", "THYROIDAB" in the last 72 hours. Anemia Panel: No results for input(s): "VITAMINB12", "FOLATE", "FERRITIN", "TIBC", "IRON", "RETICCTPCT" in the last 72 hours. Sepsis Labs: Recent Labs  Lab 08/04/23 1514  LATICACIDVEN 1.3    Recent Results (from the past 240 hour(s))  Blood culture (routine x 2)     Status: Abnormal   Collection Time: 08/04/23  1:28 PM   Specimen: BLOOD RIGHT HAND  Result Value Ref Range Status   Specimen Description BLOOD RIGHT HAND  Final   Special Requests   Final    BOTTLES DRAWN AEROBIC AND ANAEROBIC Blood Culture adequate volume   Culture  Setup Time   Final    GRAM POSITIVE COCCI IN CLUSTERS AEROBIC BOTTLE ONLY CRITICAL RESULT CALLED TO, READ BACK BY AND VERIFIED WITH: PHARMD NATHAN GOAD ON 08/06/23 @ 2037 BY DRT    Culture (A)  Final    MICROCOCCUS SPECIES Standardized susceptibility testing for this organism is not available. Performed at [facility], [address] [place], [place] [address]    Report Status 08/08/2023 FINAL  Final  Blood Culture ID Panel (Reflexed)     Status: None   Collection Time: 08/04/23  1:28 PM  Result Value Ref Range Status   Enterococcus faecalis NOT DETECTED NOT DETECTED  Final   Enterococcus Faecium NOT DETECTED NOT DETECTED Final   Listeria monocytogenes NOT DETECTED NOT DETECTED Final   Staphylococcus species NOT DETECTED NOT DETECTED Final   Staphylococcus aureus (BCID) NOT DETECTED NOT DETECTED Final   Staphylococcus epidermidis NOT DETECTED NOT DETECTED Final   Staphylococcus lugdunensis NOT DETECTED NOT DETECTED Final   Streptococcus species NOT DETECTED NOT DETECTED Final   Streptococcus agalactiae NOT DETECTED NOT DETECTED Final   Streptococcus pneumoniae NOT DETECTED NOT DETECTED Final   Streptococcus pyogenes NOT DETECTED NOT DETECTED Final   A.calcoaceticus-baumannii NOT DETECTED NOT DETECTED Final   Bacteroides fragilis NOT DETECTED NOT DETECTED Final   Enterobacterales NOT DETECTED NOT DETECTED Final   Enterobacter cloacae complex NOT DETECTED NOT DETECTED Final   Escherichia coli NOT DETECTED NOT DETECTED Final   Klebsiella aerogenes NOT DETECTED  NOT DETECTED Final   Klebsiella oxytoca NOT DETECTED NOT DETECTED Final   Klebsiella pneumoniae NOT DETECTED NOT DETECTED Final   Proteus species NOT DETECTED NOT DETECTED Final   Salmonella species NOT DETECTED NOT DETECTED Final   Serratia marcescens NOT DETECTED NOT DETECTED Final   Haemophilus influenzae NOT DETECTED NOT DETECTED Final   Neisseria meningitidis NOT DETECTED NOT DETECTED Final   Pseudomonas aeruginosa NOT DETECTED NOT DETECTED Final   Stenotrophomonas maltophilia NOT DETECTED NOT DETECTED Final   Candida albicans NOT DETECTED NOT DETECTED Final   Candida auris NOT DETECTED NOT DETECTED Final   Candida glabrata NOT DETECTED NOT DETECTED Final   Candida krusei NOT DETECTED NOT DETECTED Final   Candida parapsilosis NOT DETECTED NOT DETECTED Final   Candida tropicalis NOT DETECTED NOT DETECTED Final   Cryptococcus neoformans/gattii NOT DETECTED NOT DETECTED Final    Comment: Performed at [facility], [address] [place], [place] [address]  Blood culture (routine x 2)      Status: None   Collection Time: 08/04/23  1:57 PM   Specimen: BLOOD RIGHT FOREARM  Result Value Ref Range Status   Specimen Description BLOOD RIGHT FOREARM  Final   Special Requests   Final    BOTTLES DRAWN AEROBIC AND ANAEROBIC Blood Culture results may not be optimal due to an excessive volume of blood received in culture bottles   Culture   Final    NO GROWTH 5 DAYS Performed at [facility], [address], [place], [place] [address]    Report Status 08/09/2023 FINAL  Final  Resp panel by RT-PCR (RSV, Flu A&B, Covid) Anterior Nasal Swab     Status: None   Collection Time: 08/04/23  3:33 PM   Specimen: Anterior Nasal Swab  Result Value Ref Range Status   SARS Coronavirus 2 by RT PCR NEGATIVE NEGATIVE Final   Influenza A by PCR NEGATIVE NEGATIVE Final   Influenza B by PCR NEGATIVE NEGATIVE Final    Comment: (NOTE) The Xpert Xpress SARS-CoV-2/FLU/RSV plus assay is intended as an aid in the diagnosis of influenza from Nasopharyngeal swab specimens and should not be used as a sole basis for treatment. Nasal washings and aspirates are unacceptable for Xpert Xpress SARS-CoV-2/FLU/RSV testing.  Fact Sheet for Patients: BloggerCourse.com  Fact Sheet for Healthcare Providers: SeriousBroker.it  This test is not yet approved or cleared by the [place] FDA and has been authorized for detection and/or diagnosis of SARS-CoV-2 by FDA under an Emergency Use Authorization (EUA). This EUA will remain in effect (meaning this test can be used) for the duration of the COVID-19 declaration under Section 564(b)(1) of the Act, 21 U.S.C. section 360bbb-3(b)(1), unless the authorization is terminated or revoked.     Resp Syncytial Virus by PCR NEGATIVE NEGATIVE Final    Comment: (NOTE) Fact Sheet for Patients: BloggerCourse.com  Fact Sheet for Healthcare  Providers: SeriousBroker.it  This test is not yet approved or cleared by the [place] FDA and has been authorized for detection and/or diagnosis of SARS-CoV-2 by FDA under an Emergency Use Authorization (EUA). This EUA will remain in effect (meaning this test can be used) for the duration of the COVID-19 declaration under Section 564(b)(1) of the Act, 21 U.S.C. section 360bbb-3(b)(1), unless the authorization is terminated or revoked.  Performed at [facility], [address], [place], [place] [address]   Culture, blood (Routine X 2) w Reflex to ID Panel     Status: None (Preliminary  result)   Collection Time: 08/09/23  3:19 PM   Specimen: BLOOD LEFT HAND  Result Value Ref Range Status   Specimen Description BLOOD LEFT HAND  Final   Special Requests   Final    BOTTLES DRAWN AEROBIC ONLY Blood Culture results may not be optimal due to an inadequate volume of blood received in culture bottles   Culture   Final    NO GROWTH 2 DAYS Performed at [facility], [address], [place], [place] [address]    Report Status PENDING  Incomplete  Culture, blood (Routine X 2) w Reflex to ID Panel     Status: None (Preliminary result)   Collection Time: 08/09/23  3:19 PM   Specimen: BLOOD LEFT ARM  Result Value Ref Range Status   Specimen Description BLOOD LEFT ARM  Final   Special Requests   Final    BOTTLES DRAWN AEROBIC ONLY Blood Culture results may not be optimal due to an inadequate volume of blood received in culture bottles   Culture   Final    NO GROWTH 2 DAYS Performed at [facility], [address] [place], [place] [address]    Report Status PENDING  Incomplete         Radiology Studies: VAS [place] ABI WITH/WO TBI  Result Date: 08/10/2023  LOWER EXTREMITY DOPPLER STUDY Patient Name:  Vincent Cobb  Date of Exam:   08/10/2023 Medical Rec #: [id number]         Accession #:    [id number] Date of Birth: [birth year]         Patient  Gender: M Patient Age:   41 years Exam Location:  [facility] Procedure:      VAS [place] ABI WITH/WO TBI Referring Phys: JOSHUA ROBINS --------------------------------------------------------------------------------  Indications: Ulceration. High Risk Factors: Hypertension.  Limitations: Today's exam was limited due to patient intolerant to cuff              pressure, an open wound, bandages and involuntary patient movement. Comparison Study: No prior studies. Performing Technologist: Olen Cordial RVT  Examination Guidelines: A complete evaluation includes at minimum, Doppler waveform signals and systolic blood pressure reading at the level of bilateral brachial, anterior tibial, and posterior tibial arteries, when vessel segments are accessible. Bilateral testing is considered an integral part of a complete examination. Photoelectric Plethysmograph (PPG) waveforms and toe systolic pressure readings are included as required and additional duplex testing as needed. Limited examinations for reoccurring indications may be performed as noted.  ABI Findings: +--------+------------------+-----+-----------+--------+ Right   Rt Pressure (mmHg)IndexWaveform   Comment  +--------+------------------+-----+-----------+--------+ [id number]                    triphasic           +--------+------------------+-----+-----------+--------+ PTA     135               1.03 multiphasic         +--------+------------------+-----+-----------+--------+ DP      133               1.02 triphasic           +--------+------------------+-----+-----------+--------+ +--------+------------------+-----+-----------+-------+ Left    Lt Pressure (mmHg)IndexWaveform   Comment +--------+------------------+-----+-----------+-------+ [id number]                    triphasic          +--------+------------------+-----+-----------+-------+ PTA     137  1.05 multiphasic         +--------+------------------+-----+-----------+-------+ DP      129               0.98 multiphasic        +--------+------------------+-----+-----------+-------+ +-------+-----------+-----------+------------+------------+ ABI/TBIToday's ABIToday's TBIPrevious ABIPrevious TBI +-------+-----------+-----------+------------+------------+ Right  1.03                                           +-------+-----------+-----------+------------+------------+ Left   1.05                                           +-------+-----------+-----------+------------+------------+  Summary: Right: Resting right ankle-brachial index is within normal range. Unable to obtain TBI due to toe ulcerations. Left: Resting left ankle-brachial index is within normal range. Unable to obtain TBI due to toe ulcerations. *See table(s) above for measurements and observations.  Electronically signed by Lemar Livings MD on 08/10/2023 at 4:36:30 PM.    Final         Scheduled Meds:  baclofen  10 mg Oral TID   busPIRone  15 mg Oral BID   cefadroxil  1,000 mg Oral BID   doxycycline  100 mg Oral Q12H   ezetimibe  10 mg Oral Daily   feeding supplement  237 mL Oral BID BM   furosemide  40 mg Oral Daily   gabapentin  300 mg Oral TID   metoprolol succinate  100 mg Oral Daily   pantoprazole  80 mg Oral Daily   predniSONE  60 mg Oral Q breakfast   tamsulosin  0.4 mg Oral QPC supper   Continuous Infusions:   LOS: 7 days    Time spent: 52 minutes spent on chart review, discussion with nursing staff, consultants, updating family and interview/physical exam; more than 50% of that time was spent in counseling and/or coordination of care.    Alvira Philips [place], DO [facility] Available via Epic secure chat 7am-7pm After these hours, please refer to coverage provider listed on [email or web address] 08/11/2023, 2:15 PM

## 2023-08-11 NOTE — Progress Notes (Signed)
Mobility Specialist: Progress Note   08/11/23 1603  Mobility  Activity Moved into chair position in bed (Bed level exercises)  Level of Assistance Independent after set-up  Assistive Device Other (Comment) (theraband)  Range of Motion/Exercises Right arm;Left arm  Activity Response Tolerated well  Mobility Referral Yes  $Mobility charge 1 Mobility  Mobility Specialist Start Time (ACUTE ONLY) 1429  Mobility Specialist Stop Time (ACUTE ONLY) 1450  Mobility Specialist Time Calculation (min) (ACUTE ONLY) 21 min    Pt was agreeable to mobility session - received in bed. Performed bed level exercises with yellow theraband focusing on UE and core strengthening. Completed 40x downward cross body reaches (20x each side) and 40x straight punches (20x each arm) with the theraband. No complaints throughout. Left in bed with all needs met, call bell in reach.   Maurene Capes Mobility Specialist Please contact via SecureChat or Rehab office at [phone]

## 2023-08-12 ENCOUNTER — Other Ambulatory Visit (HOSPITAL_COMMUNITY): Payer: Self-pay

## 2023-08-12 DIAGNOSIS — L0390 Cellulitis, unspecified: Secondary | ICD-10-CM | POA: Diagnosis not present

## 2023-08-12 MED ORDER — GABAPENTIN 100 MG PO CAPS
300.0000 mg | ORAL_CAPSULE | Freq: Every day | ORAL | 0 refills | Status: AC
  Filled 2023-08-12: qty 270, 90d supply, fill #0

## 2023-08-12 MED ORDER — OXYCODONE-ACETAMINOPHEN 5-325 MG PO TABS
1.0000 | ORAL_TABLET | Freq: Four times a day (QID) | ORAL | 0 refills | Status: AC | PRN
  Filled 2023-08-12: qty 30, 8d supply, fill #0

## 2023-08-12 MED ORDER — EZETIMIBE 10 MG PO TABS
10.0000 mg | ORAL_TABLET | Freq: Every day | ORAL | 0 refills | Status: AC
  Filled 2023-08-12: qty 90, 90d supply, fill #0

## 2023-08-12 MED ORDER — DOXYCYCLINE HYCLATE 100 MG PO TABS
100.0000 mg | ORAL_TABLET | Freq: Two times a day (BID) | ORAL | 0 refills | Status: AC
  Filled 2023-08-12: qty 10, 5d supply, fill #0

## 2023-08-12 MED ORDER — METOPROLOL SUCCINATE ER 100 MG PO TB24
100.0000 mg | ORAL_TABLET | Freq: Every day | ORAL | 0 refills | Status: AC
  Filled 2023-08-12: qty 90, 90d supply, fill #0

## 2023-08-12 MED ORDER — CEFADROXIL 500 MG PO CAPS
1000.0000 mg | ORAL_CAPSULE | Freq: Two times a day (BID) | ORAL | 0 refills | Status: AC
  Filled 2023-08-12: qty 20, 5d supply, fill #0

## 2023-08-12 MED ORDER — PREDNISONE 10 MG PO TABS
ORAL_TABLET | ORAL | 0 refills | Status: AC
  Filled 2023-08-12: qty 105, 30d supply, fill #0

## 2023-08-12 MED ORDER — PANTOPRAZOLE SODIUM 40 MG PO TBEC
80.0000 mg | DELAYED_RELEASE_TABLET | Freq: Every day | ORAL | 0 refills | Status: AC
  Filled 2023-08-12: qty 180, 90d supply, fill #0

## 2023-08-12 MED ORDER — FUROSEMIDE 40 MG PO TABS
40.0000 mg | ORAL_TABLET | Freq: Every day | ORAL | 0 refills | Status: AC
  Filled 2023-08-12: qty 90, 90d supply, fill #0

## 2023-08-12 MED ORDER — TAMSULOSIN HCL 0.4 MG PO CAPS
0.4000 mg | ORAL_CAPSULE | Freq: Every day | ORAL | 0 refills | Status: AC
  Filled 2023-08-12: qty 90, 90d supply, fill #0

## 2023-08-12 MED ORDER — FLUTICASONE PROPIONATE 50 MCG/ACT NA SUSP
2.0000 | Freq: Every day | NASAL | 2 refills | Status: AC | PRN
  Filled 2023-08-12: qty 16, 30d supply, fill #0

## 2023-08-12 NOTE — TOC Transition Note (Signed)
Transition of Care [facility]) - CM/SW Discharge Note   Patient Details  Name: Vincent Cobb MRN: [id number] Date of Birth: 08/25/1961  [facility] CM/SW Contact:  Epifanio Lesches, RN Phone Number: 08/12/2023, 10:34 AM   Clinical Narrative:    Patient will DC to: Home Anticipated DC date: 10/24/204 Family notified: yes Transport by: car       - cellulitis to bilateral lower extremities/ feet Per MD patient ready for DC today.RN, patient, and patient's fiance' notified of DC. Fiance' to assist with care once home. Orders noted for DME equipment and home health services. Pt agreeable to both. Pt witout provider preference. Referral made with Amy/ [facility] and accepted for home health PT, RN services. Referral made with Zach/[facility] for w/c. Equipment will be delivered to bedside prior to d/c.  RX meds will be delivered to pt from [facility] pharmacy prior to d/c. Pt without transportation issues. Post hospital f/u noted on AVS.  RNCM will sign off for now as intervention is no longer needed. Please consult [place] again if new needs arise.    Final next level of care: Home w Home Health Services     Patient Goals and CMS Choice   Choice offered to / list presented to : Patient  Discharge Placement                         Discharge Plan and Services Additional resources added to the After Visit Summary for                  DME Arranged: Wheelchair manual DME Agency: [facility] Date DME Agency Contacted: 08/12/23 Time DME Agency Contacted: 1031 Representative spoke with at DME Agency: Ian Malkin HH Arranged: RN, PT [facility] Agency: [facility] Date [facility] Agency Contacted: 08/12/23 Time HH Agency Contacted: 1032 Representative spoke with at [facility] Agency: Amy  Social Determinants of Health (SDOH) Interventions SDOH Screenings   Food Insecurity: No Food Insecurity (08/04/2023)  Housing: Low Risk  (08/04/2023)  Transportation Needs: No Transportation  Needs (08/04/2023)  Utilities: At Risk (08/04/2023)  Tobacco Use: High Risk (08/04/2023)     Readmission Risk Interventions     No data to display

## 2023-08-12 NOTE — Discharge Summary (Signed)
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 male with past medical history significant for recent diagnosis bladder/thyroid mass, HTN, peripheral venous insufficiency, chronic back pain, atrial flutter not on anticoagulation due to hematuria who presented to [facility] ED on 10/16 from [facility] in [place] for concern of cellulitis to bilateral lower extremities and feet.  Patient reports wounds present on his feet since July 2024 and has been following with wound care clinic in [place] over the last month.  He was seen at [facility] on 10/1 for bilateral lower extremity pain with workup with CT angiogram abdominal aorta with iliofemoral runoff with no significant arterial disease and was discharged home on clindamycin and Bactrim.  He re-presented to the ED due to significant burning pain to bilateral lower extremities with reported temperature at home of 100.0 F.  He endorses continued use of tobacco, 1 pack daily over the last 10 years.    In the ED, temperature 97.8 F, HR 128, RR 20, BP 123/78, SpO2 95% on room air.  WBC 10.3, hemoglobin 9.9, platelet count 243.  Sodium 137, potassium 4.1, chloride 106, CO2 22, glucose 129, BUN 17, creat 1.46.  AST 19, ALT 12, total bilirubin 0.9.  Lactic acid 1.0.  ESR 48.  COVID/influenza/RSV PCR negative.  Right foot x-ray with diffuse soft tissue swelling calf/foot, shallow ulceration medial aspect midfoot, proximal metatarsal shaft, no definite cortical erosion to indicate osteomyelitis.  Left foot x-ray with mild diffuse soft tissue swelling in the calf/foot, 2 small shallow ulcers dorsal midfoot, no cortical erosion to indicate osteomyelitis.  Chest x-ray with no acute cardiopulmonary disease process.  Patient was started on IV antibiotics.  TRH consulted for admission for further evaluation and management of bilateral lower extremity wounds with concern for cellulitis.  Hospital course:  Bilateral lower extremity wounds/ulcerations/cellulitis Patient presenting with persistent pain to bilateral lower extremities.  History of wounds x 1 month, follows with wound care clinic and [facility]  WBC count 11.3, ESR elevated 48 on admission with normal lactic acid.  Previous workup at [facility] 10/1 with CT angiogram abdominal aorta with iliofemoral runoff with no significant arterial disease.  X-rays on admission bilateral lower extremities with soft tissue edema, shallow erosions with no concerning findings for osteomyelitis.  MR left/right foot with shallow ulcerations, no definite cortical erosion or marrow edema to suggest osteomyelitis.  Acute hepatitis panel negative.  HIV nonreactive.  Total complement within normal limits.  C4 27, within normal limits.  C3 elevated 176.  ANA negative.  Double-stranded DNA negative.  Anti-Smith antibody negative.  Rheumatoid factor within normal limits.  Vascular ultrasound lower extremities ABIs within normal limits.  Etiology unclear, suspect  microvascular  disease from chronic tobacco abuse versus possible inflammatory component with pyoderma gangrenosum.  Infectious disease was consulted and followed during hospital course.  Recommend complete antibiotic course with doxycycline, cefadroxil.  Also patient was started on prednisone taper for concern of possible inflammatory etiology of his wounds such as pyoderma gangrenosum.  Discussed with patient at no significant improvement over the next 1-2 weeks May need dermatology evaluation versus expert care at a tertiary care center with multiple modalities such as dermatology/rheumatology and would recommend biopsy at that time.   Wound care: Cleanse bilateral lower legs with soap and water and pat dry. Apply VASHE moist gauze to wound beds (weave VASHE moist kerlix between toes as well) top with dry gauze and wrap lower legs with kerlix/tape.  Change twice daily.   Essential hypertension Continue metoprolol succinate 100 mg p.o. daily, Furosemide 40 mg p.o. daily.    Hyperlipidemia Zetia 10 mg p.o. daily   Bladder tumor with hematuria Noted 2.4 cm hyperdense mass left wall of the bladder on CT angiogram 07/21/2023. Tamsulosin 0.4 mg p.o. nightly Outpatient follow-up with urology; patient requesting second opinion, will give information for follow-up with [facility]   Atrial flutter Follow-up with cardiology outpatient.  Eliquis on hold due to hematuria.  Continue metoprolol succinate 100 mg p.o. daily  Discharge Diagnoses:  Principal Problem:   Wound cellulitis Active Problems:   AKI (acute kidney injury) (HCC)   Normocytic anemia   Bladder mass   Atrial flutter (HCC)   Tobacco use   Chronic back pain    Discharge Instructions  Discharge Instructions     AMB referral to wound care center   Complete by: As directed    Call MD for:  difficulty breathing, headache or visual disturbances   Complete by: As directed    Call MD for:  extreme fatigue   Complete by: As  directed    Call MD for:  persistant dizziness or light-headedness   Complete by: As directed    Call MD for:  persistant nausea and vomiting   Complete by: As directed    Call MD for:  severe uncontrolled pain   Complete by: As directed    Call MD for:  temperature >100.4   Complete by: As directed    Diet - low sodium heart healthy   Complete by: As directed    Discharge wound care:   Complete by: As directed    Cleanse bilateral lower legs with soap and water and pat dry. Apply VASHE moist gauze to wound beds (weave VASHE moist kerlix between toes as well) top with dry gauze and wrap lower legs with kerlix/tape.  Change twice daily   Increase activity slowly   Complete by: As directed       Allergies as of 08/12/2023       Reactions   Celebrex [celecoxib] Anaphylaxis   Simvastatin Other (See Comments)   Muscle pain   Vioxx [rofecoxib]         Medication List     STOP taking these medications    clindamycin 1 % lotion Commonly known as: CLEOCIN T   losartan 50 MG tablet Commonly known as: COZAAR       TAKE these medications    busPIRone 15 MG tablet Commonly known as: BUSPAR Take 15 mg by mouth 2 (two) times daily.   cefadroxil 500 MG capsule Commonly known as: DURICEF Take 2 capsules (1,000 mg total) by mouth 2 (two) times daily for 5 days.  doxycycline 100 MG tablet Commonly known as: VIBRA-TABS Take 1 tablet (100 mg total) by mouth 2 (two) times daily for 5 days.   EPINEPHrine 0.1 MG/0.1ML Soaj Inject 0.3 mg into the muscle as needed (Anaphylaxis).   ezetimibe 10 MG tablet Commonly known as: ZETIA Take 1 tablet (10 mg total) by mouth daily.   fluticasone 50 MCG/ACT nasal spray Commonly known as: FLONASE Place 2 sprays into both nostrils daily as needed for allergies.   furosemide 40 MG tablet Commonly known as: LASIX Take 1 tablet (40 mg total) by mouth daily. What changed: how much to take   gabapentin 100 MG capsule Commonly known  as: NEURONTIN Take 3 capsules (300 mg total) by mouth daily.   metoprolol succinate 100 MG 24 hr tablet Commonly known as: TOPROL-XL Take 1 tablet (100 mg total) by mouth daily. Take with or immediately following a meal. What changed:  medication strength how much to take when to take this   oxyCODONE-acetaminophen 5-325 MG tablet Commonly known as: PERCOCET/ROXICET Take 1-2 tablets by mouth every 6 (six) hours as needed for moderate pain (pain score 4-6).   pantoprazole 40 MG tablet Commonly known as: PROTONIX Take 2 tablets (80 mg total) by mouth daily.   predniSONE 10 MG tablet Commonly known as: DELTASONE Take 6 tablets (60 mg total) by mouth daily for 5 days, THEN 5 tablets (50 mg total) daily for 5 days, THEN 4 tablets (40 mg total) daily for 5 days, THEN 3 tablets (30 mg total) daily for 5 days, THEN 2 tablets (20 mg total) daily for 5 days, THEN 1 tablet (10 mg total) daily for 5 days. Start taking on: August 13, 2023   tamsulosin 0.4 MG Caps capsule Commonly known as: FLOMAX Take 1 capsule (0.4 mg total) by mouth daily after supper.   Vitamin D (Ergocalciferol) 1.25 MG (50000 UNIT) Caps capsule Commonly known as: DRISDOL Take 50,000 Units by mouth once a week.               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  A walker will not resolve issue with performing activities of daily living. A wheelchair will allow patient to safely perform daily activities. Patient can safely propel the wheelchair in the home or has a caregiver who can provide assistance. Length of need 12 months . Accessories: elevating leg rests (ELRs), wheel locks, extensions and anti-tippers.   08/11/23 1437               Discharge Care Instructions  (From admission, onward)           Start     Ordered   08/12/23 0000  Discharge wound care:       Comments: Cleanse bilateral lower legs with soap and water and pat dry. Apply VASHE moist gauze to wound beds (weave VASHE moist kerlix between toes as well) top with dry gauze and wrap lower legs with kerlix/tape.  Change twice daily   08/12/23 1034            Follow-up Information     Vincent Neth, NP. Schedule an appointment as soon as possible for a  visit in 1 week(s).   Specialty: Family Medicine Contact information: [address] [place] [address] [phone]         [facility]. Schedule an appointment as soon as possible for a visit.   Why: For second opinion, bladder tumor Contact information: [address] [place] [address] [place] [address] [phone]        [facility]              Follow up on 08/30/2023.   Why: Wound care appointment scheduled for 08/30/2023 at 12:45 pm Contact information: [address] [place] [address] [phone]               Allergies  Allergen Reactions   Celebrex [Celecoxib] Anaphylaxis   Simvastatin Other (See Comments)    Muscle pain   Vioxx [Rofecoxib]     Consultations: Infectious disease   Procedures/Studies: VAS [place] ABI WITH/WO TBI  Result Date: 08/10/2023  LOWER EXTREMITY DOPPLER STUDY Patient Name:  KARMA STREITZ  Date of Exam:   08/10/2023 Medical Rec #: [id number]         Accession #:    [id number] Date of Birth: 1961/01/07         Patient Gender: M Patient Age:   29 years Exam Location:  [facility] Procedure:      VAS [place] ABI WITH/WO TBI Referring Phys: JOSHUA ROBINS --------------------------------------------------------------------------------  Indications: Ulceration. High Risk Factors: Hypertension.  Limitations: Today's exam was limited due to patient  intolerant to cuff              pressure, an open wound, bandages and involuntary patient movement. Comparison Study: No prior studies. Performing Technologist: Olen Cordial RVT  Examination Guidelines: A complete evaluation includes at minimum, Doppler waveform signals and systolic blood pressure reading at the level of bilateral brachial, anterior tibial, and posterior tibial arteries, when vessel segments are accessible. Bilateral testing is considered an integral part of a complete examination. Photoelectric Plethysmograph (PPG) waveforms and toe systolic pressure readings are included as required and additional duplex testing as needed. Limited examinations for reoccurring indications may be performed as noted.  ABI Findings: +--------+------------------+-----+-----------+--------+ Right   Rt Pressure (mmHg)IndexWaveform   Comment  +--------+------------------+-----+-----------+--------+ [id number]                    triphasic           +--------+------------------+-----+-----------+--------+ PTA     135               1.03 multiphasic         +--------+------------------+-----+-----------+--------+ DP      133               1.02 triphasic           +--------+------------------+-----+-----------+--------+ +--------+------------------+-----+-----------+-------+ Left    Lt Pressure (mmHg)IndexWaveform   Comment +--------+------------------+-----+-----------+-------+ [id number]                    triphasic          +--------+------------------+-----+-----------+-------+ PTA     137               1.05 multiphasic        +--------+------------------+-----+-----------+-------+ DP      129               0.98 multiphasic        +--------+------------------+-----+-----------+-------+ +-------+-----------+-----------+------------+------------+ ABI/TBIToday's ABIToday's TBIPrevious ABIPrevious TBI +-------+-----------+-----------+------------+------------+  Right   1.03                                           +-------+-----------+-----------+------------+------------+ Left   1.05                                           +-------+-----------+-----------+------------+------------+  Summary: Right: Resting right ankle-brachial index is within normal range. Unable to obtain TBI due to toe ulcerations. Left: Resting left ankle-brachial index is within normal range. Unable to obtain TBI due to toe ulcerations. *See table(s) above for measurements and observations.  Electronically signed by Lemar Livings MD on 08/10/2023 at 4:36:30 PM.    Final    MR FOOT LEFT WO CONTRAST  Result Date: 08/06/2023 CLINICAL DATA:  Foot swelling. Diabetes. Osteomyelitis suspected. Bilateral foot scattered ulcerated wounds. Worse within the bilateral second and third digits EXAM: MRI OF THE LEFT FOOT WITHOUT CONTRAST TECHNIQUE: Multiplanar, multisequence MR imaging of the left forefoot was performed. No intravenous contrast was administered. The technologist reports with the patient's feet bandaged, the foot coil could not be used. A flex coil had to be used. COMPARISON:  Left foot radiographs with purulent material. Surrounding necrosis toe ulcers and spreading erythema up the lower extremity. FINDINGS: Bones/Joint/Cartilage Moderate great toe metatarsophalangeal osteoarthritis including high-grade partial and full-thickness cartilage loss, moderate peripheral degenerative spurring, and mild subchondral cystic change. Mild to moderate great toe interphalangeal cartilage thinning and mild-to-moderate subchondral cystic change. Moderate talonavicular, navicular-cuneiform, and tarsometatarsal cartilage thinning and dorsal osteophytosis. There are multiple shallow dorsal midfoot and toe ulcers as described below. No definite cortical erosion is seen with attention to the adjacent bones. No marrow edema is seen to indicate acute osteomyelitis. Ligaments The Lisfranc ligament complex is  intact. The collateral ligaments are intact. The great toe sesamoid phalangeal ligaments are intact. Muscles and Tendons Mild fatty infiltration and edema of the plantar midfoot musculature diffusely, nonspecific. The flexor and extensor tendons appear intact. Soft tissues There appears to be a shallow ulcer at the dorsal medial aspect of the great toe near the interphalangeal joint (axial series 5, image 9 and sagittal series 3, image 11). There are multiple shallow ulcerations within the proximal dorsal midfoot (axial image 58/58 and sagittal images 34 and 35 of 42). Within the slightly more distal dorsal midfoot there are additional shallow ulcerations (axial images 49 through 54). Small ulcer at the distal dorsal aspect of fifth toe (sagittal series 3 image 36 and axial series 4, image 22). Likely additional small ulcers at the dorsal aspect of the great toe (sagittal image 15), dorsal aspect of second toe (sagittal images 23 and 24), and dorsal aspect of fourth toe (sagittal image 32). There is mild dorsal midfoot subcutaneous fat edema. IMPRESSION: 1. Multiple shallow ulcerations within the dorsal midfoot and toes. No definite cortical erosion or marrow edema is seen with attention to the adjacent bones to indicate acute osteomyelitis. 2. Moderate great toe metatarsophalangeal and mild-to-moderate great toe interphalangeal osteoarthritis. 3. Moderate talonavicular, navicular-cuneiform, and tarsometatarsal osteoarthritis. Electronically Signed   By: Neita Garnet M.D.   On: 08/06/2023 08:43   MR FOOT RIGHT WO CONTRAST  Result Date: 08/06/2023 CLINICAL DATA:  Diabetes. Foot swelling. Osteomyelitis suspected. Ulcerated wounds to bilateral feet with edema Scattered ulcerations of bilateral dorsal  feet, worse to dorsal second and third digits on both feet with purulent material. Surrounding necrosis tall ulcers and spreading erythema up lower extremity. Ulcer is also noted to right pretibial and mid calf  region. EXAM: MRI OF THE RIGHT FOREFOOT WITHOUT CONTRAST TECHNIQUE: Multiplanar, multisequence MR imaging of the right forefoot was performed. No intravenous contrast was administered. The technologist notes due to the patient's feet being bandaged, the patient's foot did not fit in the foot coil anteflexed coil had to be used. COMPARISON:  Right foot radiographs 08/04/2023 FINDINGS: Despite efforts by the technologist and patient, mild to moderate motion artifact is present on today's exam and could not be eliminated. This reduces exam sensitivity and specificity. Bones/Joint/Cartilage No definite cortical erosion or marrow edema within the great toe phalanges to indicate acute osteomyelitis in the region of the suspected small soft tissue ulcer on yesterday's radiographs, although no definite ulcer is seen on the current MRI, as described below. Inhomogeneous fat saturation on T2 fat saturation sequences limits evaluation for marrow edema, and no definite true marrow edema is seen on the sagittal STIR sequences with better homogeneity of the fat suppression. Mild-to-moderate great toe metatarsophalangeal cartilage thinning, subchondral cystic change, and peripheral osteophytosis. Mild-to-moderate tarsometatarsal cartilage thinning and peripheral degenerative spurring. Moderate dorsal navicular-cuneiform cartilage thinning and subchondral cystic change (sagittal series 10, image 23). Moderate talonavicular cartilage thinning. There is subchondral cystic change measuring up to 2.9 cm in craniocaudal dimension and 2.1 cm in AP dimension within the tibial plafond at the tibiotalar joint, imaged only on sagittal sequences (sagittal series 10, image 28). Ligaments The Lisfranc ligament complex is intact. The collateral ligaments are intact. The great toe metatarsophalangeal sesamoid phalangeal ligaments are intact. Muscles and Tendons The flexor and extensor tendons are grossly intact, within limitations of motion  artifact on axial sequences. There is mild-to-moderate fatty infiltration of the intrinsic foot musculature. Soft tissues There is mild-to-moderate dorsal midfoot subcutaneous fat edema and swelling. There is a small oval decreased T1 and increased T2 signal fluid focus that likely represents two adjacent cystic regions bordering the distal medial aspect of the proximal phalanx of the great toe (sagittal series 10 images 30 through 32). There appears to be a thin "neck" extending from the great toe interphalangeal joint towards this likely tiny ganglion (sagittal series 10, image 31). This also in the region of lucency on 08/04/2023 prior day radiographs. No definite ulceration is seen in this region of the medial great toe. No definite ulcer is seen within the region of the suspected shallow ulcer within the medial midfoot soft tissues medial to the base of the first metatarsal on prior day radiographs. IMPRESSION: 1. The suspected shallow ulcerations medial to the base of the great toe metatarsal and medial to the distal aspect of the proximal phalanx of the great toe on prior day 08/04/2023 radiographs are not appreciated on MRI. Small oval fluid focus bordering the distal medial aspect of the proximal phalanx of the great toe, likely a tiny ganglion in the region of paucity associated soft tissue lucency seen on 08/04/2023 prior day radiographs. 2. No definite cortical erosion or marrow edema is seen to indicate acute osteomyelitis, with attention to the great toe phalanges and medial base of the great toe metatarsal in the regions of suspected ulcers on prior radiographs. 3. Mild-to-moderate dorsal midfoot subcutaneous fat edema and swelling. 4. Mild-to-moderate great toe metatarsophalangeal, mild-to-moderate tarsometatarsal, and moderate talonavicular osteoarthritis. Subchondral cystic change measuring up to 2.9 cm in craniocaudal dimension and  2.1 cm in AP dimension within the tibial plafond at the tibiotalar  joint. Electronically Signed   By: Neita Garnet M.D.   On: 08/06/2023 08:32   DG Chest 1 View  Result Date: 08/04/2023 CLINICAL DATA:  62 year old male with lower extremity cellulitis. Pain. Chills, nausea vomiting. EXAM: CHEST  1 VIEW COMPARISON:  Portable chest 05/19/2005. FINDINGS: Portable AP semi upright view at 1537 hours. Lung volumes and mediastinal contours remain within normal limits. Visualized tracheal air column is within normal limits. Allowing for portable technique the lungs are clear. No pneumothorax or pleural effusion. Negative visible bowel gas. No acute osseous abnormality identified. IMPRESSION: Negative portable chest. Electronically Signed   By: Odessa Fleming M.D.   On: 08/04/2023 18:28   DG Foot Complete Right  Result Date: 08/04/2023 CLINICAL DATA:  Cellulitis. Infection has been present for 3 weeks. Pain. EXAM: RIGHT FOOT COMPLETE - 3+ VIEW COMPARISON:  None Available. FINDINGS: There is diffuse soft tissue swelling of the calf and foot. Diffuse decreased bone mineralization. Moderate plantar and tiny posterior calcaneal heel spurs. Mild dorsal talonavicular, navicular-cuneiform, and tarsometatarsal degenerative osteophytes. There are multiple lucencies within the soft tissues of the dorsal ankle and midfoot, likely subcutaneous edema. Possible tiny shallow ulcer at the medial aspect of the midfoot at the level of the proximal metatarsal shaft best seen on oblique view. On oblique view there also appears to be an ulcer measuring up to approximately 16 x 10 mm just medial to the distal aspect of the proximal phalanx of the great toe. No cortical erosion is seen. IMPRESSION: 1. Diffuse soft tissue swelling of the calf and foot. 2. Possible tiny shallow ulcer at the medial aspect of the midfoot at the level of the proximal metatarsal shaft. 3. Ulcer measuring up to approximately 16 x 10 mm just medial to the distal aspect of the proximal phalanx of the great toe. 4. No definite cortical  erosion to indicate radiographic evidence of osteomyelitis. Electronically Signed   By: Neita Garnet M.D.   On: 08/04/2023 17:42   DG Foot Complete Left  Result Date: 08/04/2023 CLINICAL DATA:  Cellulitis. Infection has been present for 3 weeks but worsened recently. Cellulitis. EXAM: LEFT FOOT - COMPLETE 3+ VIEW COMPARISON:  None Available. FINDINGS: There is mild diffuse soft tissue swelling of the calf and foot. Small plantar calcaneal heel spur. There are two small shallow likely ulcers of the dorsal midfoot measuring only up to 2 mm in craniocaudal depth, seen on lateral view. No cortical erosion is seen.  No subcutaneous air. Mild great toe metatarsophalangeal joint space narrowing and peripheral osteophytosis. IMPRESSION: 1. Mild diffuse soft tissue swelling of the calf and foot. 2. Two small shallow likely ulcers of the dorsal midfoot. 3. No cortical erosion to indicate radiographic evidence of osteomyelitis. Electronically Signed   By: Neita Garnet M.D.   On: 08/04/2023 16:49     Subjective: Patient seen examined bedside, lying in bed.  Significant other present.  Seen by infectious disease, now signed off with recommendation of continued oral antibiotics on discharge with doxycycline and cefadroxil.  Discussed with general surgery, unable to perform punch biopsy; also discussed with infectious disease as they defer any biopsy to the outpatient setting.  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  Discussed with patient and significant other that if wounds continue to worsen despite aggressive treatment, would recommend seeing a dermatologist versus seek further care at a tertiary center with subspecialty care  such as dermatology, rheumatology with ability to perform biopsy for definitive treatment.  Autoimmune workup was unrevealing.  No other specific questions, concerns at this time.  Denies headache, no dizziness, no chest pain, no palpitations,  no shortness of breath, no abdominal pain, no fever/chills/night sweats, no nausea/vomiting/diarrhea, no focal weakness, no fatigue, no paresthesias.  No acute events overnight per nursing staff.  Discharge Exam: Vitals:   08/12/23 0436 08/12/23 0825  BP: (!) 150/93 124/77  Pulse: 100 88  Resp: 19 17  Temp: 98 F (36.7 C) 97.9 F (36.6 C)  SpO2: 98%    Vitals:   08/11/23 1551 08/11/23 1958 08/12/23 0436 08/12/23 0825  BP: 109/77 133/78 (!) 150/93 124/77  Pulse: 77 66 100 88  Resp: 18 18 19 17   Temp: 97.9 F (36.6 C) 98.2 F (36.8 C) 98 F (36.7 C) 97.9 F (36.6 C)  TempSrc: Oral Oral Oral Oral  SpO2:  100% 98%   Weight:      Height:        Physical Exam: GEN: NAD, alert and oriented x 3, obese, chronically ill appearance, appears older than stated age HEENT: NCAT, PERRL, EOMI, sclera clear, MMM PULM: CTAB w/o wheezes/crackles, normal respiratory effort, on room air CV: RRR w/o M/G/R GI: abd soft, NTND, NABS, no R/G/M MSK: no peripheral edema, muscle strength globally intact 5/5 bilateral upper/lower extremities NEURO: CN II-XII intact, no focal deficits, sensation to light touch intact PSYCH: normal mood/affect Integumentary: Bilateral lower extremity shallow ulcerations noted as depicted below, otherwise no other concerning rashes/lesions/wounds noted on exposed skin surfaces                The results of significant diagnostics from this hospitalization (including imaging, microbiology, ancillary and laboratory) are listed below for reference.     Microbiology: Recent Results (from the past 240 hour(s))  Blood culture (routine x 2)     Status: Abnormal   Collection Time: 08/04/23  1:28 PM   Specimen: BLOOD RIGHT HAND  Result Value Ref Range Status   Specimen Description BLOOD RIGHT HAND  Final   Special Requests   Final    BOTTLES DRAWN AEROBIC AND ANAEROBIC Blood Culture adequate volume   Culture  Setup Time   Final    GRAM POSITIVE COCCI IN  CLUSTERS AEROBIC BOTTLE ONLY CRITICAL RESULT CALLED TO, READ BACK BY AND VERIFIED WITH: PHARMD NATHAN GOAD ON 08/06/23 @ 2037 BY DRT    Culture (A)  Final    MICROCOCCUS SPECIES Standardized susceptibility testing for this organism is not available. Performed at [facility], [address] [place], [place] [address]    Report Status 08/08/2023 FINAL  Final  Blood Culture ID Panel (Reflexed)     Status: None   Collection Time: 08/04/23  1:28 PM  Result Value Ref Range Status   Enterococcus faecalis NOT DETECTED NOT DETECTED Final   Enterococcus Faecium NOT DETECTED NOT DETECTED Final   Listeria monocytogenes NOT DETECTED NOT DETECTED Final   Staphylococcus species NOT DETECTED NOT DETECTED Final   Staphylococcus aureus (BCID) NOT DETECTED NOT DETECTED Final   Staphylococcus epidermidis NOT DETECTED NOT DETECTED Final   Staphylococcus lugdunensis NOT DETECTED NOT DETECTED Final   Streptococcus species NOT DETECTED NOT DETECTED Final   Streptococcus agalactiae NOT DETECTED NOT DETECTED Final   Streptococcus pneumoniae NOT DETECTED NOT DETECTED Final   Streptococcus pyogenes NOT DETECTED NOT DETECTED Final   A.calcoaceticus-baumannii NOT DETECTED NOT DETECTED Final   Bacteroides fragilis NOT DETECTED  NOT DETECTED Final   Enterobacterales NOT DETECTED NOT DETECTED Final   Enterobacter cloacae complex NOT DETECTED NOT DETECTED Final   Escherichia coli NOT DETECTED NOT DETECTED Final   Klebsiella aerogenes NOT DETECTED NOT DETECTED Final   Klebsiella oxytoca NOT DETECTED NOT DETECTED Final   Klebsiella pneumoniae NOT DETECTED NOT DETECTED Final   Proteus species NOT DETECTED NOT DETECTED Final   Salmonella species NOT DETECTED NOT DETECTED Final   Serratia marcescens NOT DETECTED NOT DETECTED Final   Haemophilus influenzae NOT DETECTED NOT DETECTED Final   Neisseria meningitidis NOT DETECTED NOT DETECTED Final   Pseudomonas aeruginosa NOT DETECTED NOT DETECTED Final    Stenotrophomonas maltophilia NOT DETECTED NOT DETECTED Final   Candida albicans NOT DETECTED NOT DETECTED Final   Candida auris NOT DETECTED NOT DETECTED Final   Candida glabrata NOT DETECTED NOT DETECTED Final   Candida krusei NOT DETECTED NOT DETECTED Final   Candida parapsilosis NOT DETECTED NOT DETECTED Final   Candida tropicalis NOT DETECTED NOT DETECTED Final   Cryptococcus neoformans/gattii NOT DETECTED NOT DETECTED Final    Comment: Performed at [facility], [address] [place], [place] [address]  Blood culture (routine x 2)     Status: None   Collection Time: 08/04/23  1:57 PM   Specimen: BLOOD RIGHT FOREARM  Result Value Ref Range Status   Specimen Description BLOOD RIGHT FOREARM  Final   Special Requests   Final    BOTTLES DRAWN AEROBIC AND ANAEROBIC Blood Culture results may not be optimal due to an excessive volume of blood received in culture bottles   Culture   Final    NO GROWTH 5 DAYS Performed at [facility], [address], [place], [place] [address]    Report Status 08/09/2023 FINAL  Final  Resp panel by RT-PCR (RSV, Flu A&B, Covid) Anterior Nasal Swab     Status: None   Collection Time: 08/04/23  3:33 PM   Specimen: Anterior Nasal Swab  Result Value Ref Range Status   SARS Coronavirus 2 by RT PCR NEGATIVE NEGATIVE Final   Influenza A by PCR NEGATIVE NEGATIVE Final   Influenza B by PCR NEGATIVE NEGATIVE Final    Comment: (NOTE) The Xpert Xpress SARS-CoV-2/FLU/RSV plus assay is intended as an aid in the diagnosis of influenza from Nasopharyngeal swab specimens and should not be used as a sole basis for treatment. Nasal washings and aspirates are unacceptable for Xpert Xpress SARS-CoV-2/FLU/RSV testing.  Fact Sheet for Patients: BloggerCourse.com  Fact Sheet for Healthcare Providers: SeriousBroker.it  This test is not yet approved or cleared by the [place] FDA and has been authorized  for detection and/or diagnosis of SARS-CoV-2 by FDA under an Emergency Use Authorization (EUA). This EUA will remain in effect (meaning this test can be used) for the duration of the COVID-19 declaration under Section 564(b)(1) of the Act, 21 U.S.C. section 360bbb-3(b)(1), unless the authorization is terminated or revoked.     Resp Syncytial Virus by PCR NEGATIVE NEGATIVE Final    Comment: (NOTE) Fact Sheet for Patients: BloggerCourse.com  Fact Sheet for Healthcare Providers: SeriousBroker.it  This test is not yet approved or cleared by the [place] FDA and has been authorized for detection and/or diagnosis of SARS-CoV-2 by FDA under an Emergency Use Authorization (EUA). This EUA will remain in effect (meaning this test can be used) for the duration of the COVID-19 declaration under Section 564(b)(1) of the Act, 21 U.S.C. section 360bbb-3(b)(1), unless the authorization is  terminated or revoked.  Performed at [facility], [address] [place], [place] [address]   Culture, blood (Routine X 2) w Reflex to ID Panel     Status: None (Preliminary result)   Collection Time: 08/09/23  3:19 PM   Specimen: BLOOD LEFT HAND  Result Value Ref Range Status   Specimen Description BLOOD LEFT HAND  Final   Special Requests   Final    BOTTLES DRAWN AEROBIC ONLY Blood Culture results may not be optimal due to an inadequate volume of blood received in culture bottles   Culture   Final    NO GROWTH 3 DAYS Performed at [facility], [address] [place], [place] [address]    Report Status PENDING  Incomplete  Culture, blood (Routine X 2) w Reflex to ID Panel     Status: None (Preliminary result)   Collection Time: 08/09/23  3:19 PM   Specimen: BLOOD LEFT ARM  Result Value Ref Range Status   Specimen Description BLOOD LEFT ARM  Final   Special Requests   Final    BOTTLES DRAWN AEROBIC ONLY Blood Culture results may not be  optimal due to an inadequate volume of blood received in culture bottles   Culture   Final    NO GROWTH 3 DAYS Performed at [facility], [address], [place], [place] [address]    Report Status PENDING  Incomplete     Labs: BNP (last 3 results) No results for input(s): "BNP" in the last 8760 hours. Basic Metabolic Panel: Recent Labs  Lab 08/06/23 0813 08/07/23 0435 08/08/23 0935 08/11/23 0545  NA 140 138 138 135  K 3.7 3.4* 4.1 4.0  CL 106 106 107 105  CO2 21* 20* 21* 21*  GLUCOSE 92 111* 103* 98  BUN 12 14 14 21   CREATININE 1.10 1.28* 1.12 1.36*  CALCIUM 10.1 9.0 9.5 9.2  MG  --   --  1.7 1.9  PHOS  --   --  3.3  --    Liver Function Tests: Recent Labs  Lab 08/08/23 0935  ALBUMIN 2.8*   No results for input(s): "LIPASE", "AMYLASE" in the last 168 hours. No results for input(s): "AMMONIA" in the last 168 hours. CBC: Recent Labs  Lab 08/06/23 0813 08/07/23 0435 08/08/23 0935 08/11/23 0545  WBC 9.9 9.0 10.3 9.8  HGB 9.3* 8.9* 9.4* 9.9*  HCT 28.5* 27.1* 28.8* 29.4*  MCV 92.2 93.1 92.9 90.7  PLT 202 199 215 249   Cardiac Enzymes: No results for input(s): "CKTOTAL", "CKMB", "CKMBINDEX", "TROPONINI" in the last 168 hours. BNP: Invalid input(s): "POCBNP" CBG: Recent Labs  Lab 08/06/23 1131  GLUCAP 86   D-Dimer No results for input(s): "DDIMER" in the last 72 hours. Hgb A1c No results for input(s): "HGBA1C" in the last 72 hours. Lipid Profile No results for input(s): "CHOL", "HDL", "LDLCALC", "TRIG", "CHOLHDL", "LDLDIRECT" in the last 72 hours. Thyroid function studies No results for input(s): "TSH", "T4TOTAL", "T3FREE", "THYROIDAB" in the last 72 hours.  Invalid input(s): "FREET3" Anemia work up No results for input(s): "VITAMINB12", "FOLATE", "FERRITIN", "TIBC", "IRON", "RETICCTPCT" in the last 72 hours. Urinalysis    Component Value Date/Time   COLORURINE RED (A) 08/04/2023 1401   APPEARANCEUR TURBID (A) 08/04/2023 1401   LABSPEC   08/04/2023 1401    TEST NOT REPORTED DUE TO COLOR INTERFERENCE OF URINE PIGMENT   PHURINE  08/04/2023 1401    TEST NOT REPORTED DUE TO COLOR INTERFERENCE OF  URINE PIGMENT   GLUCOSEU NEGATIVE 08/04/2023 1401   HGBUR (A) 08/04/2023 1401    TEST NOT REPORTED DUE TO COLOR INTERFERENCE OF URINE PIGMENT   BILIRUBINUR (A) 08/04/2023 1401    TEST NOT REPORTED DUE TO COLOR INTERFERENCE OF URINE PIGMENT   KETONESUR (A) 08/04/2023 1401    TEST NOT REPORTED DUE TO COLOR INTERFERENCE OF URINE PIGMENT   PROTEINUR (A) 08/04/2023 1401    TEST NOT REPORTED DUE TO COLOR INTERFERENCE OF URINE PIGMENT   NITRITE (A) 08/04/2023 1401    TEST NOT REPORTED DUE TO COLOR INTERFERENCE OF URINE PIGMENT   LEUKOCYTESUR (A) 08/04/2023 1401    TEST NOT REPORTED DUE TO COLOR INTERFERENCE OF URINE PIGMENT   Sepsis Labs Recent Labs  Lab 08/06/23 0813 08/07/23 0435 08/08/23 0935 08/11/23 0545  WBC 9.9 9.0 10.3 9.8   Microbiology Recent Results (from the past 240 hour(s))  Blood culture (routine x 2)     Status: Abnormal   Collection Time: 08/04/23  1:28 PM   Specimen: BLOOD RIGHT HAND  Result Value Ref Range Status   Specimen Description BLOOD RIGHT HAND  Final   Special Requests   Final    BOTTLES DRAWN AEROBIC AND ANAEROBIC Blood Culture adequate volume   Culture  Setup Time   Final    GRAM POSITIVE COCCI IN CLUSTERS AEROBIC BOTTLE ONLY CRITICAL RESULT CALLED TO, READ BACK BY AND VERIFIED WITH: PHARMD NATHAN GOAD ON 08/06/23 @ 2037 BY DRT    Culture (A)  Final    MICROCOCCUS SPECIES Standardized susceptibility testing for this organism is not available. Performed at [facility], [address] [place], [place] [address]    Report Status 08/08/2023 FINAL  Final  Blood Culture ID Panel (Reflexed)     Status: None   Collection Time: 08/04/23  1:28 PM  Result Value Ref Range Status   Enterococcus faecalis NOT DETECTED NOT DETECTED Final   Enterococcus Faecium NOT DETECTED NOT DETECTED Final    Listeria monocytogenes NOT DETECTED NOT DETECTED Final   Staphylococcus species NOT DETECTED NOT DETECTED Final   Staphylococcus aureus (BCID) NOT DETECTED NOT DETECTED Final   Staphylococcus epidermidis NOT DETECTED NOT DETECTED Final   Staphylococcus lugdunensis NOT DETECTED NOT DETECTED Final   Streptococcus species NOT DETECTED NOT DETECTED Final   Streptococcus agalactiae NOT DETECTED NOT DETECTED Final   Streptococcus pneumoniae NOT DETECTED NOT DETECTED Final   Streptococcus pyogenes NOT DETECTED NOT DETECTED Final   A.calcoaceticus-baumannii NOT DETECTED NOT DETECTED Final   Bacteroides fragilis NOT DETECTED NOT DETECTED Final   Enterobacterales NOT DETECTED NOT DETECTED Final   Enterobacter cloacae complex NOT DETECTED NOT DETECTED Final   Escherichia coli NOT DETECTED NOT DETECTED Final   Klebsiella aerogenes NOT DETECTED NOT DETECTED Final   Klebsiella oxytoca NOT DETECTED NOT DETECTED Final   Klebsiella pneumoniae NOT DETECTED NOT DETECTED Final   Proteus species NOT DETECTED NOT DETECTED Final   Salmonella species NOT DETECTED NOT DETECTED Final   Serratia marcescens NOT DETECTED NOT DETECTED Final   Haemophilus influenzae NOT DETECTED NOT DETECTED Final   Neisseria meningitidis NOT DETECTED NOT DETECTED Final   Pseudomonas aeruginosa NOT DETECTED NOT DETECTED Final   Stenotrophomonas maltophilia NOT DETECTED NOT DETECTED Final   Candida albicans NOT DETECTED NOT DETECTED Final   Candida auris NOT DETECTED NOT DETECTED Final   Candida glabrata NOT DETECTED NOT DETECTED Final   Candida krusei NOT DETECTED NOT DETECTED Final   Candida parapsilosis NOT DETECTED NOT  DETECTED Final   Candida tropicalis NOT DETECTED NOT DETECTED Final   Cryptococcus neoformans/gattii NOT DETECTED NOT DETECTED Final    Comment: Performed at [facility], [address], [place], [place] [address]  Blood culture (routine x 2)     Status: None   Collection Time: 08/04/23  1:57 PM    Specimen: BLOOD RIGHT FOREARM  Result Value Ref Range Status   Specimen Description BLOOD RIGHT FOREARM  Final   Special Requests   Final    BOTTLES DRAWN AEROBIC AND ANAEROBIC Blood Culture results may not be optimal due to an excessive volume of blood received in culture bottles   Culture   Final    NO GROWTH 5 DAYS Performed at [facility], [address], [place], [place] [address]    Report Status 08/09/2023 FINAL  Final  Resp panel by RT-PCR (RSV, Flu A&B, Covid) Anterior Nasal Swab     Status: None   Collection Time: 08/04/23  3:33 PM   Specimen: Anterior Nasal Swab  Result Value Ref Range Status   SARS Coronavirus 2 by RT PCR NEGATIVE NEGATIVE Final   Influenza A by PCR NEGATIVE NEGATIVE Final   Influenza B by PCR NEGATIVE NEGATIVE Final    Comment: (NOTE) The Xpert Xpress SARS-CoV-2/FLU/RSV plus assay is intended as an aid in the diagnosis of influenza from Nasopharyngeal swab specimens and should not be used as a sole basis for treatment. Nasal washings and aspirates are unacceptable for Xpert Xpress SARS-CoV-2/FLU/RSV testing.  Fact Sheet for Patients: BloggerCourse.com  Fact Sheet for Healthcare Providers: SeriousBroker.it  This test is not yet approved or cleared by the [place] FDA and has been authorized for detection and/or diagnosis of SARS-CoV-2 by FDA under an Emergency Use Authorization (EUA). This EUA will remain in effect (meaning this test can be used) for the duration of the COVID-19 declaration under Section 564(b)(1) of the Act, 21 U.S.C. section 360bbb-3(b)(1), unless the authorization is terminated or revoked.     Resp Syncytial Virus by PCR NEGATIVE NEGATIVE Final    Comment: (NOTE) Fact Sheet for Patients: BloggerCourse.com  Fact Sheet for Healthcare Providers: SeriousBroker.it  This test is not yet approved or cleared by the [place] FDA and has been authorized for detection and/or diagnosis of SARS-CoV-2 by FDA under an Emergency Use Authorization (EUA). This EUA will remain in effect (meaning this test can be used) for the duration of the COVID-19 declaration under Section 564(b)(1) of the Act, 21 U.S.C. section 360bbb-3(b)(1), unless the authorization is terminated or revoked.  Performed at [facility], [address], [place], [place] [address]   Culture, blood (Routine X 2) w Reflex to ID Panel     Status: None (Preliminary result)   Collection Time: 08/09/23  3:19 PM   Specimen: BLOOD LEFT HAND  Result Value Ref Range Status   Specimen Description BLOOD LEFT HAND  Final   Special Requests   Final    BOTTLES DRAWN AEROBIC ONLY Blood Culture results may not be optimal due to an inadequate volume of blood received in culture bottles   Culture   Final    NO GROWTH 3 DAYS Performed at [facility], [address] [place], [place] [address]    Report Status PENDING  Incomplete  Culture, blood (Routine X 2) w Reflex to ID Panel     Status: None (Preliminary result)   Collection Time: 08/09/23  3:19 PM   Specimen: BLOOD  LEFT ARM  Result Value Ref Range Status   Specimen Description BLOOD LEFT ARM  Final   Special Requests   Final    BOTTLES DRAWN AEROBIC ONLY Blood Culture results may not be optimal due to an inadequate volume of blood received in culture bottles   Culture   Final    NO GROWTH 3 DAYS Performed at [facility], [address], [place], [place] [address]    Report Status PENDING  Incomplete     Time coordinating discharge: Over 30 minutes  SIGNED:   Alvira Philips [place], DO  [facility] 08/12/2023, 10:34 AM

## 2023-08-12 NOTE — Progress Notes (Signed)
    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  A walker will not resolve issue with performing activities of daily living. A wheelchair will allow patient to safely perform daily activities. Patient can safely propel the wheelchair in the home or has a caregiver who can provide assistance. Length of need 12 months . Accessories: elevating leg rests (ELRs), wheel locks, extensions and anti-tippers.   08/11/23 1437

## 2023-08-12 NOTE — Care Management Important Message (Signed)
Important Message  Patient Details  Name: Vincent Cobb MRN: [id number] Date of Birth: 22-Feb-1961   Important Message Given:  Yes - Medicare IM     Sherilyn Banker 08/12/2023, 11:47 AM

## 2023-08-12 NOTE — Progress Notes (Signed)
[facility]  Date of Admission:  08/04/2023     Total days of antibiotics 9         ASSESSMENT:  Vincent Cobb continues to have worsening of his bilateral lower extremity wounds/ulcerations/cellulitis with new lesion on his chest. Started on prednisone with concern for possible auto-immune origin given no improvements with continued antibiotics and making infectious cause less likely. Recommend continued cefadroxil and doxycycline with current plan for discharge and recommended follow up with dermatology and/or evaluation at tertiary facility if does not improve. Will arrange follow up in ID clinic for continuity. Remaining medical and supportive care per Internal Medicine. Discussed plan of care with [name] who is in agreement.   PLAN:  Change antibiotics to Cefadroxil and Doxycycline.  Agree with Dermatology or possible tertiary care evaluation. Follow up in ID clinic.  Remaining medical and supportive care per Internal Medicine.   I have personally spent 28 minutes involved in face-to-face and non-face-to-face activities for this patient on the day of the visit. Professional time spent includes the following activities: Preparing to see the patient (review of tests), Obtaining and/or reviewing separately obtained history (admission/discharge record), Performing a medically appropriate examination and/or evaluation , Ordering medications/tests/procedures, referring and communicating with other health care professionals, Documenting clinical information in the EMR, Independently interpreting results (not separately reported), Communicating results to the patient/family/caregiver, Counseling and educating the patient/family/caregiver and Care coordination (not separately reported).    Principal Problem:   Wound cellulitis Active Problems:   AKI (acute kidney injury) (HCC)   Normocytic anemia   Bladder mass   Atrial flutter (HCC)   Tobacco use    Chronic back pain    baclofen  10 mg Oral TID   busPIRone  15 mg Oral BID   cefadroxil  1,000 mg Oral BID   doxycycline  100 mg Oral Q12H   ezetimibe  10 mg Oral Daily   feeding supplement  237 mL Oral BID BM   furosemide  40 mg Oral Daily   gabapentin  300 mg Oral TID   metoprolol succinate  100 mg Oral Daily   pantoprazole  80 mg Oral Daily   predniSONE  60 mg Oral Q breakfast   tamsulosin  0.4 mg Oral QPC supper    SUBJECTIVE:  Afebrile overnight with continued spread of lesions with new located on his chest.   Allergies  Allergen Reactions   Celebrex [Celecoxib] Anaphylaxis   Simvastatin Other (See Comments)    Muscle pain   Vioxx [Rofecoxib]      Review of Systems: Review of Systems  Constitutional:  Negative for chills, fever and weight loss.  Respiratory:  Negative for cough, shortness of breath and wheezing.   Cardiovascular:  Negative for chest pain and leg swelling.  Gastrointestinal:  Negative for abdominal pain, constipation, diarrhea, nausea and vomiting.  Skin:  Positive for rash.      OBJECTIVE: Vitals:   08/11/23 1551 08/11/23 1958 08/12/23 0436 08/12/23 0825  BP: 109/77 133/78 (!) 150/93 124/77  Pulse: 77 66 100 88  Resp: 18 18 19 17   Temp: 97.9 F (36.6 C) 98.2 F (36.8 C) 98 F (36.7 C) 97.9 F (36.6 C)  TempSrc: Oral Oral Oral Oral  SpO2:  100% 98%   Weight:      Height:       Body mass index is 31.71 kg/m.  Physical Exam Constitutional:      General: He is not in acute distress.  Appearance: He is well-developed.  Cardiovascular:     Rate and Rhythm: Normal rate and regular rhythm.     Heart sounds: Normal heart sounds.  Pulmonary:     Effort: Pulmonary effort is normal.     Breath sounds: Normal breath sounds.  Skin:    General: Skin is warm and dry.     Comments: Bilateral lower extremities wrapped. New lesion on chest.   Neurological:     Mental Status: He is alert.     Lab Results Lab Results  Component Value  Date   WBC 9.8 08/11/2023   HGB 9.9 (L) 08/11/2023   HCT 29.4 (L) 08/11/2023   MCV 90.7 08/11/2023   PLT 249 08/11/2023    Lab Results  Component Value Date   CREATININE 1.36 (H) 08/11/2023   BUN 21 08/11/2023   NA 135 08/11/2023   K 4.0 08/11/2023   CL 105 08/11/2023   CO2 21 (L) 08/11/2023    Lab Results  Component Value Date   ALT 12 08/04/2023   AST 19 08/04/2023   ALKPHOS 105 08/04/2023   BILITOT 0.6 08/04/2023     Microbiology: Recent Results (from the past 240 hour(s))  Blood culture (routine x 2)     Status: Abnormal   Collection Time: 08/04/23  1:28 PM   Specimen: BLOOD RIGHT HAND  Result Value Ref Range Status   Specimen Description BLOOD RIGHT HAND  Final   Special Requests   Final    BOTTLES DRAWN AEROBIC AND ANAEROBIC Blood Culture adequate volume   Culture  Setup Time   Final    GRAM POSITIVE COCCI IN CLUSTERS AEROBIC BOTTLE ONLY CRITICAL RESULT CALLED TO, READ BACK BY AND VERIFIED WITH: PHARMD NATHAN GOAD ON 08/06/23 @ 2037 BY DRT    Culture (A)  Final    MICROCOCCUS SPECIES Standardized susceptibility testing for this organism is not available. Performed at [facility], [address], [place], [place] [address]    Report Status 08/08/2023 FINAL  Final  Blood Culture ID Panel (Reflexed)     Status: None   Collection Time: 08/04/23  1:28 PM  Result Value Ref Range Status   Enterococcus faecalis NOT DETECTED NOT DETECTED Final   Enterococcus Faecium NOT DETECTED NOT DETECTED Final   Listeria monocytogenes NOT DETECTED NOT DETECTED Final   Staphylococcus species NOT DETECTED NOT DETECTED Final   Staphylococcus aureus (BCID) NOT DETECTED NOT DETECTED Final   Staphylococcus epidermidis NOT DETECTED NOT DETECTED Final   Staphylococcus lugdunensis NOT DETECTED NOT DETECTED Final   Streptococcus species NOT DETECTED NOT DETECTED Final   Streptococcus agalactiae NOT DETECTED NOT DETECTED Final   Streptococcus pneumoniae NOT DETECTED NOT DETECTED  Final   Streptococcus pyogenes NOT DETECTED NOT DETECTED Final   A.calcoaceticus-baumannii NOT DETECTED NOT DETECTED Final   Bacteroides fragilis NOT DETECTED NOT DETECTED Final   Enterobacterales NOT DETECTED NOT DETECTED Final   Enterobacter cloacae complex NOT DETECTED NOT DETECTED Final   Escherichia coli NOT DETECTED NOT DETECTED Final   Klebsiella aerogenes NOT DETECTED NOT DETECTED Final   Klebsiella oxytoca NOT DETECTED NOT DETECTED Final   Klebsiella pneumoniae NOT DETECTED NOT DETECTED Final   Proteus species NOT DETECTED NOT DETECTED Final   Salmonella species NOT DETECTED NOT DETECTED Final   Serratia marcescens NOT DETECTED NOT DETECTED Final   Haemophilus influenzae NOT DETECTED NOT DETECTED Final   Neisseria meningitidis NOT DETECTED NOT DETECTED Final   Pseudomonas aeruginosa NOT DETECTED NOT DETECTED Final  Stenotrophomonas maltophilia NOT DETECTED NOT DETECTED Final   Candida albicans NOT DETECTED NOT DETECTED Final   Candida auris NOT DETECTED NOT DETECTED Final   Candida glabrata NOT DETECTED NOT DETECTED Final   Candida krusei NOT DETECTED NOT DETECTED Final   Candida parapsilosis NOT DETECTED NOT DETECTED Final   Candida tropicalis NOT DETECTED NOT DETECTED Final   Cryptococcus neoformans/gattii NOT DETECTED NOT DETECTED Final    Comment: Performed at [facility], [address] [place], [place] [address]  Blood culture (routine x 2)     Status: None   Collection Time: 08/04/23  1:57 PM   Specimen: BLOOD RIGHT FOREARM  Result Value Ref Range Status   Specimen Description BLOOD RIGHT FOREARM  Final   Special Requests   Final    BOTTLES DRAWN AEROBIC AND ANAEROBIC Blood Culture results may not be optimal due to an excessive volume of blood received in culture bottles   Culture   Final    NO GROWTH 5 DAYS Performed at [facility], [address], [place], [place] [address]    Report Status 08/09/2023 FINAL  Final  Resp panel by RT-PCR (RSV, Flu  A&B, Covid) Anterior Nasal Swab     Status: None   Collection Time: 08/04/23  3:33 PM   Specimen: Anterior Nasal Swab  Result Value Ref Range Status   SARS Coronavirus 2 by RT PCR NEGATIVE NEGATIVE Final   Influenza A by PCR NEGATIVE NEGATIVE Final   Influenza B by PCR NEGATIVE NEGATIVE Final    Comment: (NOTE) The Xpert Xpress SARS-CoV-2/FLU/RSV plus assay is intended as an aid in the diagnosis of influenza from Nasopharyngeal swab specimens and should not be used as a sole basis for treatment. Nasal washings and aspirates are unacceptable for Xpert Xpress SARS-CoV-2/FLU/RSV testing.  Fact Sheet for Patients: BloggerCourse.com  Fact Sheet for Healthcare Providers: SeriousBroker.it  This test is not yet approved or cleared by the [place] FDA and has been authorized for detection and/or diagnosis of SARS-CoV-2 by FDA under an Emergency Use Authorization (EUA). This EUA will remain in effect (meaning this test can be used) for the duration of the COVID-19 declaration under Section 564(b)(1) of the Act, 21 U.S.C. section 360bbb-3(b)(1), unless the authorization is terminated or revoked.     Resp Syncytial Virus by PCR NEGATIVE NEGATIVE Final    Comment: (NOTE) Fact Sheet for Patients: BloggerCourse.com  Fact Sheet for Healthcare Providers: SeriousBroker.it  This test is not yet approved or cleared by the [place] FDA and has been authorized for detection and/or diagnosis of SARS-CoV-2 by FDA under an Emergency Use Authorization (EUA). This EUA will remain in effect (meaning this test can be used) for the duration of the COVID-19 declaration under Section 564(b)(1) of the Act, 21 U.S.C. section 360bbb-3(b)(1), unless the authorization is terminated or revoked.  Performed at [facility], [address] [place], [place] [address]   Culture, blood (Routine X 2)  w Reflex to ID Panel     Status: None (Preliminary result)   Collection Time: 08/09/23  3:19 PM   Specimen: BLOOD LEFT HAND  Result Value Ref Range Status   Specimen Description BLOOD LEFT HAND  Final   Special Requests   Final    BOTTLES DRAWN AEROBIC ONLY Blood Culture results may not be optimal due to an inadequate volume of blood received in culture bottles   Culture   Final    NO GROWTH 3 DAYS Performed at  [facility], [address], [place], [place] [address]    Report Status PENDING  Incomplete  Culture, blood (Routine X 2) w Reflex to ID Panel     Status: None (Preliminary result)   Collection Time: 08/09/23  3:19 PM   Specimen: BLOOD LEFT ARM  Result Value Ref Range Status   Specimen Description BLOOD LEFT ARM  Final   Special Requests   Final    BOTTLES DRAWN AEROBIC ONLY Blood Culture results may not be optimal due to an inadequate volume of blood received in culture bottles   Culture   Final    NO GROWTH 3 DAYS Performed at [facility], [address] [place], [place] [address]    Report Status PENDING  Incomplete     Marcos Eke, NP [facility]  08/12/2023  12:23 PM

## 2023-08-12 NOTE — Progress Notes (Signed)
Patient discharged.  Tech removed PIV.  Changed BLE dressings.  Reviewed discharge instructions, medications and follow up appts with patient and wife.  Answered questions.  Gave patient copy of discharge instructions.  Prescriptions were filled at [facility] pharmacy.  Gave patient and wife dressing change supplies and educated wife on dressing change.    No additional questions or concerns at this time.

## 2023-08-12 NOTE — Progress Notes (Addendum)
[facility]  Date of Admission:  08/04/2023   Total days of inpatient antibiotics7  Principal Problem:   Wound cellulitis Active Problems:   AKI (acute kidney injury) (HCC)   Normocytic anemia   Bladder mass   Atrial flutter (HCC)   Tobacco use   Chronic back pain          Assessment: 62 year old male with recently diagnosed bladder mass, thyroid mass and followed by wound care for bilateral lower extremity swelling starting this year then seen in the ED on 10/1 where he was prescribed clindamycin and Bactrim for lower extremity wounds admitted with: #Lower lower extremity wounds suspicious for cellulitis #Opiate dependence, 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  Right foot noted subchondral cyst - Patient notes that his UDS is positive due to taking his leftover methadone.  He notes that he was followed at methadone clinic for years for opioid dependence(pills) following back surgery/back pain. -She has been on antibiotics since admission with Vanco, cefepime, metronidazole without improvement of wounds.  His also had some low-grade fevers hospitalization. Recommendations:  -Continue  doxy and cefadroxil., esr and crp elevated. Pt's wound are worsening with a week of abx. I suspect non-infectious etiology.  - hepatitis panel, autoimmune w/u negative except for C3 complement elevated, ANCA and cryoglobulin pending    #Bladder mass - Follows with urology in [place]   #Blood cultures 1/4 micrococcus - Consistent with contamination   Microbiology:   Antibiotics: Vancomycin, cefepime 10/16-present Metronidazole 10/21   Cultures: Blood 10/61/4 micrococcus 10/21 NG   SUBJECTIVE: Resting in bed.  Interval: tmax 100.1 overnight  Review of Systems: Review of Systems  All other systems reviewed and are negative.    Scheduled Meds:  baclofen  10 mg Oral TID    busPIRone  15 mg Oral BID   cefadroxil  1,000 mg Oral BID   doxycycline  100 mg Oral Q12H   ezetimibe  10 mg Oral Daily   feeding supplement  237 mL Oral BID BM   furosemide  40 mg Oral Daily   gabapentin  300 mg Oral TID   metoprolol succinate  100 mg Oral Daily   pantoprazole  80 mg Oral Daily   predniSONE  60 mg Oral Q breakfast   tamsulosin  0.4 mg Oral QPC supper   Continuous Infusions: PRN Meds:.acetaminophen, ALPRAZolam, cyclobenzaprine, HYDROmorphone (DILAUDID) injection, metoprolol tartrate, oxyCODONE-acetaminophen Allergies  Allergen Reactions   Celebrex [Celecoxib] Anaphylaxis   Simvastatin Other (See Comments)    Muscle pain   Vioxx [Rofecoxib]     OBJECTIVE: Vitals:   08/11/23 0539 08/11/23 0817 08/11/23 1551 08/11/23 1958  BP: 112/74 (!) 138/91 109/77 133/78  Pulse: 98 (!) 106 77 66  Resp: 18  18 18   Temp: 98 F (36.7 C) 98.2 F (36.8 C) 97.9 F (36.6 C) 98.2 F (36.8 C)  TempSrc:  Oral Oral Oral  SpO2: 97% 99%  100%  Weight:      Height:       Body mass index is 31.71 kg/m.  Physical Exam Constitutional:      General: He is not in acute distress.    Appearance: He is normal weight. He is not toxic-appearing.  HENT:     Head: Normocephalic and atraumatic.     Right Ear: External ear normal.     Left Ear: External ear normal.     Nose:  No congestion or rhinorrhea.     Mouth/Throat:     Mouth: Mucous membranes are moist.     Pharynx: Oropharynx is clear.  Eyes:     Extraocular Movements: Extraocular movements intact.     Conjunctiva/sclera: Conjunctivae normal.     Pupils: Pupils are equal, round, and reactive to light.  Cardiovascular:     Rate and Rhythm: Normal rate and regular rhythm.     Heart sounds: No murmur heard.    No friction rub. No gallop.  Pulmonary:     Effort: Pulmonary effort is normal.     Breath sounds: Normal breath sounds.  Abdominal:     General: Abdomen is flat. Bowel sounds are normal.     Palpations: Abdomen is  soft.  Musculoskeletal:        General: No swelling.     Cervical back: Normal range of motion and neck supple.     Comments: B/l feet bandaged  Skin:    General: Skin is warm and dry.  Neurological:     General: No focal deficit present.     Mental Status: He is oriented to person, place, and time.  Psychiatric:        Mood and Affect: Mood normal.       Lab Results Lab Results  Component Value Date   WBC 9.8 08/11/2023   HGB 9.9 (L) 08/11/2023   HCT 29.4 (L) 08/11/2023   MCV 90.7 08/11/2023   PLT 249 08/11/2023    Lab Results  Component Value Date   CREATININE 1.36 (H) 08/11/2023   BUN 21 08/11/2023   NA 135 08/11/2023   K 4.0 08/11/2023   CL 105 08/11/2023   CO2 21 (L) 08/11/2023    Lab Results  Component Value Date   ALT 12 08/04/2023   AST 19 08/04/2023   ALKPHOS 105 08/04/2023   BILITOT 0.6 08/04/2023        Danelle Earthly, MD [facility] 08/12/2023, 3:48 AM I have personally spent 51 minutes involved in face-to-face and non-face-to-face activities for this patient on the day of the visit. Professional time spent includes the following activities: Preparing to see the patient (review of tests), Obtaining and/or reviewing separately obtained history (admission/discharge record), Performing a medically appropriate examination and/or evaluation , Ordering medications/tests/procedures, referring and communicating with other health care professionals, Documenting clinical information in the EMR, Independently interpreting results (not separately reported), Communicating results to the patient/family/caregiver, Counseling and educating the patient/family/caregiver and Care coordination (not separately reported).

## 2023-08-14 LAB — CULTURE, BLOOD (ROUTINE X 2)
Culture: NO GROWTH
Culture: NO GROWTH

## 2023-08-14 LAB — CRYOGLOBULIN

## 2023-08-30 ENCOUNTER — Ambulatory Visit (HOSPITAL_BASED_OUTPATIENT_CLINIC_OR_DEPARTMENT_OTHER): Payer: Medicare HMO | Admitting: General Surgery

## 2023-09-01 ENCOUNTER — Inpatient Hospital Stay: Payer: Medicare HMO | Admitting: Internal Medicine

## 2023-09-24 ENCOUNTER — Other Ambulatory Visit (HOSPITAL_COMMUNITY): Payer: Self-pay

## 2023-11-20 DEATH — deceased
